# Patient Record
Sex: Female | Born: 1955 | Race: Black or African American | Hispanic: No | Marital: Married | Smoking: Current some day smoker
Health system: Southern US, Community
[De-identification: ages and names within clinical notes are randomized; demographics above are authoritative.]

## PROBLEM LIST (undated history)

## (undated) DIAGNOSIS — K219 Gastro-esophageal reflux disease without esophagitis: Secondary | ICD-10-CM

## (undated) DIAGNOSIS — G43519 Persistent migraine aura without cerebral infarction, intractable, without status migrainosus: Secondary | ICD-10-CM

## (undated) DIAGNOSIS — I10 Essential (primary) hypertension: Secondary | ICD-10-CM

## (undated) DIAGNOSIS — Z803 Family history of malignant neoplasm of breast: Secondary | ICD-10-CM

## (undated) DIAGNOSIS — E119 Type 2 diabetes mellitus without complications: Secondary | ICD-10-CM

## (undated) HISTORY — DX: Family history of malignant neoplasm of breast: Z80.3

---

## 1999-12-27 ENCOUNTER — Ambulatory Visit (HOSPITAL_BASED_OUTPATIENT_CLINIC_OR_DEPARTMENT_OTHER): Admission: RE | Admit: 1999-12-27 | Discharge: 1999-12-27 | Payer: Self-pay | Admitting: Internal Medicine

## 2005-04-01 ENCOUNTER — Ambulatory Visit (HOSPITAL_COMMUNITY): Admission: RE | Admit: 2005-04-01 | Discharge: 2005-04-01 | Payer: Self-pay | Admitting: Orthopedic Surgery

## 2012-01-01 ENCOUNTER — Other Ambulatory Visit: Payer: Self-pay | Admitting: Internal Medicine

## 2012-01-01 DIAGNOSIS — I83813 Varicose veins of bilateral lower extremities with pain: Secondary | ICD-10-CM

## 2012-01-22 ENCOUNTER — Other Ambulatory Visit: Payer: Self-pay

## 2012-01-28 ENCOUNTER — Other Ambulatory Visit: Payer: Self-pay | Admitting: Internal Medicine

## 2012-01-28 ENCOUNTER — Ambulatory Visit
Admission: RE | Admit: 2012-01-28 | Discharge: 2012-01-28 | Disposition: A | Discharge status: Home / Self Care | Payer: BC Managed Care – PPO | Source: Ambulatory Visit | Attending: Internal Medicine | Admitting: Internal Medicine

## 2012-01-28 DIAGNOSIS — I83813 Varicose veins of bilateral lower extremities with pain: Secondary | ICD-10-CM

## 2012-01-31 NOTE — Progress Notes (Signed)
Patient ID: Alexa Mora, female   DOB: 25-Sep-1955, 56 y.o.   MRN: [id number]  She is a 56 year old female who complains of lower extremity pain, left side greater than right. Her main complaint is aching, burning and itching along the left posterior and left medial calf. She has not worn compression stockings in the past. She does take ibuprofen 800 mg as needed for her lower extremity pain. She has also had some symptoms associated with sciatica and plantar fasciitis. 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 She is now retired. She has a maternal aunt who also has problems with varicose veins. She took hormone replacement therapy for six months but stopped that six years ago. Pregnancy history is G3, P2.  Past medical history is significant for high blood pressure, cholesterol, migraines, multiple thyroid nodules and depression. Surgery includes bilateral elbow surgeries and left knee surgery.  Medications: Norvasc 10 mg, Allegra 180 mg, aspirin 81 mg, vitamin D, Mobic 7.5 mg. She takes Keppra for migraines.  Allergies: None.  Social History: She is married and retired. She lives in [place]. She smokes occasionally; a pack lasts her 1-2 weeks. She does drink caffeine. She denies alcohol use.  Review of systems: Overall, she feels she is in good health. She denies ear, nose, mouth or throat problems. She denies gastrointestinal or genitourinary problems. She is post menopausal. Cardiovascular is significant for high blood pressure and high cholesterol. Musculoskeletal is significant for bilateral elbow surgery and left knee surgery. Neurologic is significant for migraines. She denies pulmonary problems.  Physical examination: Vital signs: Temperature 98.1, respirations 16, pulse 68, blood pressure 134/78. Oxygen saturation is 98% on room air. General: Healthy appearing Page 2 Re: Alexa Mora Kitchen (DOB: 2055-10-04)  white female.  Heart: Regular rate and rhythm. Lungs: Clear to auscultation. Abdomen: Soft and nontender. Palpable groin and pedal pulses bilaterally.  Lower extremities: She has varicosities along the medial and posterior aspect of the left calf. The left calf is slightly larger than the right. No significant spider veins or telangiectasias on either lower extremity. No definite varicosities in the right lower extremity.  Imaging: Ultrasound of the left lower extremity is negative for left lower extremity DVT. The study is positive for venous insufficiency involving the short saphenous vein. Multiple varicosities associated with the enlarged short saphenous vein. There is very mild reflux involving the great saphenous vein in the proximal calf.  Assessment: 56 year old with symptomatic venous insufficiency and varicosities in the left lower leg. The left lower extremity symptoms are significant enough that the patient takes anti- inflammatory medication to control the symptoms. The ultrasound examination demonstrates significant venous insufficiency in the short saphenous vein with large associated varicosities. I believe the patient would be a good candidate for endovascular laser treatment to the short saphenous vein and she may need additional sclerotherapy for treatment of the varicosities. The patient has never worn support hose or compression stockings in the past. I will prescribe her bilateral lower extremity compression stockings measuring 20-30 mmHg. We will try three months of conservative treatment. If the conservative therapy does not help with the patient's symptoms, then we will proceed with endovascular laser treatment of the short saphenous vein and ultrasound guided sclerotherapy.

## 2012-03-25 ENCOUNTER — Other Ambulatory Visit: Payer: Self-pay | Admitting: Diagnostic Radiology

## 2012-03-25 DIAGNOSIS — I83812 Varicose veins of left lower extremities with pain: Secondary | ICD-10-CM

## 2012-04-29 ENCOUNTER — Ambulatory Visit
Admission: RE | Admit: 2012-04-29 | Discharge: 2012-04-29 | Disposition: A | Discharge status: Home / Self Care | Payer: BC Managed Care – PPO | Source: Ambulatory Visit | Attending: Diagnostic Radiology | Admitting: Diagnostic Radiology

## 2012-04-29 DIAGNOSIS — I83812 Varicose veins of left lower extremities with pain: Secondary | ICD-10-CM

## 2012-04-29 HISTORY — DX: Essential (primary) hypertension: I10

## 2012-04-29 HISTORY — DX: Type 2 diabetes mellitus without complications: E11.9

## 2012-04-29 HISTORY — DX: Gastro-esophageal reflux disease without esophagitis: K21.9

## 2012-04-29 HISTORY — DX: Persistent migraine aura without cerebral infarction, intractable, without status migrainosus: G43.519

## 2012-04-29 NOTE — Progress Notes (Signed)
"  Legs still hurt" despite wearing compression stockings for the past three months.  jkl

## 2012-05-22 ENCOUNTER — Other Ambulatory Visit: Payer: Self-pay | Admitting: Diagnostic Radiology

## 2012-05-22 DIAGNOSIS — I83819 Varicose veins of unspecified lower extremities with pain: Secondary | ICD-10-CM

## 2012-08-20 ENCOUNTER — Ambulatory Visit: Payer: Self-pay | Admitting: Otolaryngology

## 2012-09-10 ENCOUNTER — Ambulatory Visit: Payer: Self-pay | Admitting: Otolaryngology

## 2012-10-22 ENCOUNTER — Ambulatory Visit: Payer: Self-pay | Admitting: Otolaryngology

## 2013-12-02 ENCOUNTER — Other Ambulatory Visit (HOSPITAL_COMMUNITY): Payer: Self-pay | Admitting: Diagnostic Radiology

## 2013-12-02 DIAGNOSIS — I83812 Varicose veins of left lower extremities with pain: Secondary | ICD-10-CM

## 2013-12-08 ENCOUNTER — Ambulatory Visit
Admission: RE | Admit: 2013-12-08 | Discharge: 2013-12-08 | Disposition: A | Discharge status: Home / Self Care | Payer: BC Managed Care – PPO | Source: Ambulatory Visit | Attending: Diagnostic Radiology | Admitting: Diagnostic Radiology

## 2013-12-08 ENCOUNTER — Encounter (INDEPENDENT_AMBULATORY_CARE_PROVIDER_SITE_OTHER): Payer: Self-pay

## 2013-12-08 DIAGNOSIS — I83812 Varicose veins of left lower extremities with pain: Secondary | ICD-10-CM

## 2013-12-09 ENCOUNTER — Telehealth: Payer: Self-pay | Admitting: Radiology

## 2013-12-09 NOTE — Telephone Encounter (Signed)
Call to remind patient to monitor B/P at home/pharmacy.  B/P was elevated yesterday, 167/78.  Advised patient to follow up with primary care physician for elevated B/P.    Patient is in agreement.  Eliana Lueth Riki R[place], [place] 12/09/2013 9:59 AM

## 2014-01-13 ENCOUNTER — Telehealth: Payer: Self-pay | Admitting: *Deleted

## 2014-01-13 ENCOUNTER — Telehealth: Payer: Self-pay | Admitting: Emergency Medicine

## 2014-01-13 NOTE — Telephone Encounter (Signed)
Left message for pt to return my call so I can schedule a genetic appt.  

## 2014-01-13 NOTE — Telephone Encounter (Signed)
CALLED PT TO MAKE HER AWARE THAT HER INS. HAS APPROVED HER EVLT.  PT WANTS TO WAIT TO SCHEDULE FOR MID SEPT.  WE WILL CALL HER WHEN WE HAVE THE SCHEDULE AVA.

## 2014-01-13 NOTE — Telephone Encounter (Signed)
Pt returned my call and I confirmed 01/26/14 genetic appt w/ her.  Mickeal Skinner at referring to make her aware.  Took paperwork upstairs for counselor.  Mailed welcoming packet & calendar to pt.

## 2014-01-26 ENCOUNTER — Other Ambulatory Visit: Payer: BC Managed Care – PPO

## 2014-01-26 ENCOUNTER — Ambulatory Visit (HOSPITAL_BASED_OUTPATIENT_CLINIC_OR_DEPARTMENT_OTHER): Payer: BC Managed Care – PPO | Admitting: Genetic Counselor

## 2014-01-26 ENCOUNTER — Encounter: Payer: Self-pay | Admitting: Genetic Counselor

## 2014-01-26 DIAGNOSIS — IMO0002 Reserved for concepts with insufficient information to code with codable children: Secondary | ICD-10-CM

## 2014-01-26 DIAGNOSIS — Z803 Family history of malignant neoplasm of breast: Secondary | ICD-10-CM

## 2014-01-26 NOTE — Progress Notes (Signed)
Patient Name: Alexa Mora Patient Age: 58 y.o. Encounter Date: 01/26/2014  Referring Provider: Lucita Lora, PA    Ms. Alexa Mora, a 58 y.o. female, is being seen at the [facility] due to a family history of breast cancer.  She presents to clinic today to discuss the possibility of a hereditary predisposition to cancer and discuss whether genetic testing is warranted.  HISTORY OF PRESENT ILLNESS: Ms. Alexa Mora has no personal history of cancer and is in generally good health. She stated that she has a yearly mammogram, gynecologic exam and clinical breast exam. She reported that her colonoscopy last year was negative for polyps.  Past Medical History  Diagnosis Date  . Migraine aura, persistent, intractable   . GERD (gastroesophageal reflux disease)   . Hypertension   . Diabetes mellitus without complication   . Family history of malignant neoplasm of breast     History   Social History  . Marital Status: Married    Spouse Name: N/A    Number of Children: N/A  . Years of Education: N/A   Social History Main Topics  . Smoking status: Current Some Day Smoker -- 30 years    Types: Cigarettes  . Smokeless tobacco: Never Used  . Alcohol Use: No  . Drug Use: No  . Sexual Activity: Not on file   Other Topics Concern  . Not on file   Social History Narrative  . No narrative on file     FAMILY HISTORY:   During the visit, a 4-generation pedigree was obtained. Significant diagnoses include the following:  Family History  Problem Relation Age of Onset  . Breast cancer Sister 52    currently 81  . Breast cancer Cousin     Age dx?; currently 61; paternal first-cousin    Additionally, Ms. Alexa Mora has 2 daughters (age 55 and 43). She has 3 other sisters and 2 brothers who are all cancer-free. Her mother is 56 and cancer-free. Her mother had 4 sisters and 5 brothers who were all cancer-free as were Ms. Alexa Mora's maternal grandparents.  Ms. Alexa Mora father  died at 66, cancer-free. He had 2 full brothers and several paternal half-siblings who were all cancer-free. One of Ms. Alexa Mora's paternal first-cousins had post-menopausal breast cancer. Her paternal grandparents died in their 77s, cancer-free.  Ms. Alexa Mora ancestry is African American. There is no known Jewish ancestry and no consanguinity.  ASSESSMENT AND PLAN: Ms. Alexa Mora is a 58 y.o. female with a family history of breast cancer at later ages of onset in her sister and one paternal first cousin. This history is not suggestive of a hereditary predisposition to cancer. She has a very large family with many unaffected relatives. We reviewed the characteristics, features and inheritance patterns of hereditary cancer syndromes to explain why she is at low risk.   We discussed with Ms. Alexa Mora that the family history is not consistent with a familial hereditary cancer syndrome, and we feel she is at low risk to harbor a gene mutation associated with such a condition. Genetic testing was not recommend at this time. We recommended Ms. Alexa Mora continue to have a yearly mammogram, a yearly clinical breast exam, perform monthly breast self-exams and have a yearly gynecologic exam. She indicated that her colonosocpy last year was negative for polyps. We will defer to her GI physician as to when her next screening should be.   We encouraged Ms. Alexa Mora to remain in contact with Cancer Genetics annually so that we can  update the family history and inform her of any changes in cancer genetics and testing that may be of benefit for this family. Ms.  Alexa Mora questions were answered to her satisfaction today.   Thank you for the referral and allowing [place] to share in the care of your patient.   The patient was seen for a total of 25 minutes, greater than 50% of which was spent face-to-face counseling. This patient was discussed with the overseeing provider who agrees with the above.   Steele Berg, MS, [place] Certified Genetic  Counseor phone: [phone] [name]

## 2014-03-04 ENCOUNTER — Other Ambulatory Visit (HOSPITAL_COMMUNITY): Payer: Self-pay | Admitting: Diagnostic Radiology

## 2014-03-04 DIAGNOSIS — I872 Venous insufficiency (chronic) (peripheral): Secondary | ICD-10-CM

## 2014-06-08 ENCOUNTER — Ambulatory Visit: Payer: BC Managed Care – PPO

## 2014-06-08 ENCOUNTER — Inpatient Hospital Stay
Admission: RE | Admit: 2014-06-08 | Discharge: 2014-06-08 | Disposition: A | Discharge status: Home / Self Care | Payer: BC Managed Care – PPO | Source: Ambulatory Visit | Attending: Diagnostic Radiology | Admitting: Diagnostic Radiology

## 2014-06-13 ENCOUNTER — Other Ambulatory Visit: Payer: BC Managed Care – PPO

## 2014-06-13 ENCOUNTER — Ambulatory Visit: Payer: BC Managed Care – PPO

## 2014-06-16 ENCOUNTER — Other Ambulatory Visit: Payer: BC Managed Care – PPO

## 2014-08-15 ENCOUNTER — Other Ambulatory Visit (HOSPITAL_COMMUNITY): Payer: Self-pay | Admitting: Diagnostic Radiology

## 2014-08-15 DIAGNOSIS — I872 Venous insufficiency (chronic) (peripheral): Secondary | ICD-10-CM

## 2014-08-25 ENCOUNTER — Ambulatory Visit
Admission: RE | Admit: 2014-08-25 | Discharge: 2014-08-25 | Disposition: A | Discharge status: Home / Self Care | Payer: Self-pay | Source: Ambulatory Visit | Attending: Diagnostic Radiology | Admitting: Diagnostic Radiology

## 2014-08-25 DIAGNOSIS — I872 Venous insufficiency (chronic) (peripheral): Secondary | ICD-10-CM

## 2014-08-25 NOTE — Progress Notes (Signed)
4742  Informed consent obtained.  Patient given verbal & written discharge instructions & states that she understands.  0800  Valium 5 mg po per Dr Anselm Pancoast.  0805  22 gauge x 1" insyte angiocath started IV Left antecubital (on 1st attempt).  Flushed w/ 10 mL NSS without difficulty.  Site unremarkable.  5956  Procedure started.  1045  Procedure completed.  Tolerated well.  Wearing thigh high graduated compression garment (20-30 mm Hg) on Left leg.  1055  IV discontinued, catheter intact.  Site unremarkable.  1100  Ambulated x 10 mins without assistance.  Gait steady.   1115  Discharged to home.  Spouse available to provide transportation.  Chancelor Hardrick [place], RN 08/25/2014 11:18 AM

## 2014-08-26 ENCOUNTER — Other Ambulatory Visit: Payer: Self-pay

## 2014-08-26 ENCOUNTER — Ambulatory Visit: Payer: Self-pay

## 2014-08-31 ENCOUNTER — Ambulatory Visit
Admission: RE | Admit: 2014-08-31 | Discharge: 2014-08-31 | Disposition: A | Discharge status: Home / Self Care | Payer: BLUE CROSS/BLUE SHIELD | Source: Ambulatory Visit | Attending: Diagnostic Radiology | Admitting: Diagnostic Radiology

## 2014-08-31 ENCOUNTER — Other Ambulatory Visit (HOSPITAL_COMMUNITY): Payer: Self-pay | Admitting: Diagnostic Radiology

## 2014-08-31 DIAGNOSIS — I872 Venous insufficiency (chronic) (peripheral): Secondary | ICD-10-CM

## 2014-08-31 NOTE — Progress Notes (Signed)
Chief Complaint: Status post endovenous laser occlusion of the left short saphenous vein on 08/25/2014 by Dr. Anselm Pancoast.  History of Present Illness: Alexa Mora is a 59 y.o. female one week status post laser occlusion of the left short saphenous vein to treat symptomatic venous insufficiency. The patient has done well since treatment and has already noticed significant decrease in left lower extremity pain. She has been wearing her compression stockings nearly 24 hours a day since treatment. She has had no difficulty ambulating or other symptoms in the left lower externa. She denies fever. She would like to start exercising.  Past Medical History  Diagnosis Date  . Migraine aura, persistent, intractable   . GERD (gastroesophageal reflux disease)   . Hypertension   . Diabetes mellitus without complication   . Family history of malignant neoplasm of breast     No past surgical history on file.  Allergies: Sulfa antibiotics  Medications: Prior to Admission medications   Medication Sig Start Date End Date Taking? Authorizing Provider  amLODipine (NORVASC) 10 MG tablet Take 10 mg by mouth daily.    Historical Provider, MD  aspirin 81 MG tablet Take 81 mg by mouth daily.    Historical Provider, MD  cholecalciferol (VITAMIN D) 1000 UNITS tablet Take 2,000 Units by mouth daily.    Historical Provider, MD  diphenhydrAMINE (BENADRYL) 25 MG tablet Take 25 mg by mouth every 6 (six) hours as needed.    Historical Provider, MD  fexofenadine (ALLEGRA) 180 MG tablet Take 180 mg by mouth daily.    Historical Provider, MD  levETIRAcetam (KEPPRA) 500 MG tablet Take 500 mg by mouth daily.    Historical Provider, MD  meloxicam (MOBIC) 15 MG tablet Take 15 mg by mouth daily.    Historical Provider, MD  omeprazole (PRILOSEC) 10 MG capsule Take 20 mg by mouth daily.    Historical Provider, MD  ranitidine (ZANTAC) 150 MG tablet Take 150 mg by mouth 2 (two) times daily.    Historical Provider, MD    rosuvastatin (CRESTOR) 5 MG tablet Take 5 mg by mouth daily.    Historical Provider, MD    Family History  Problem Relation Age of Onset  . Breast cancer Sister 77    currently 21  . Breast cancer Cousin     Age dx?; currently 32; paternal first-cousin    History   Social History  . Marital Status: Married    Spouse Name: N/A  . Number of Children: N/A  . Years of Education: N/A   Social History Main Topics  . Smoking status: Current Some Day Smoker -- 30 years    Types: Cigarettes  . Smokeless tobacco: Never Used  . Alcohol Use: No  . Drug Use: No  . Sexual Activity: Not on file   Other Topics Concern  . Not on file   Social History Narrative  . No narrative on file    Review of Systems: A 12 point ROS discussed and pertinent positives are indicated in the HPI above.  All other systems are negative.  Review of Systems  Constitutional: Negative.   Musculoskeletal: Negative.   Neurological: Negative.     Vital Signs: There were no vitals taken for this visit.  Physical Exam  Constitutional: No distress.  Musculoskeletal: She exhibits no edema or tenderness.  No ecchymosis, hematoma or drainage at the treatment site. Lower calf entry site is healed. No evidence of erythema or tenderness.  Skin: She is not diaphoretic.  Imaging: Ir Ileana Ladd Venous Not [place] [employer]  08/25/2014   CLINICAL DATA:  59 year old with symptomatic left lower extremity venous insufficiency. The venous insufficiency involves the left short saphenous vein.  EXAM: ENDOVASCULAR LASER TREATMENT TO THE LEFT SHORT SAPHENOUS VEIN  Physician: Stephan Minister. Anselm Pancoast, MD  FLUOROSCOPY TIME:  None  MEDICATIONS AND MEDICAL HISTORY: Valium oral  ANESTHESIA/SEDATION: Moderate sedation time: None  PROCEDURE: The procedure was explained to the patient. The risks and benefits of the procedure were discussed and that patient's questions were addressed. Informed consent was obtained from the  patient. The patient was placed in a prone position. The left lower extremity superficial venous system was evaluated with ultrasound. The left short saphenous vein was identified and the vein path was marked on the skin. The posterior left lower thigh and left calf were prepped with Betadine and a sterile drape was placed. Maximal barrier sterile technique was utilized including sterile gowns, sterile gloves, sterile drape, hand hygiene and skin antiseptic. The posterior lower calf was anesthetized with 1% lidocaine. A 21 gauge needle was directed into the short saphenous vein with ultrasound guidance and a micropuncture dilator set was placed. Wire was advanced into the proximal left short saphenous vein. A 0.035 wire was advanced to the saphenopopliteal junction with ultrasound guidance. A 45 cm sheath was placed over the wire and positioned distal to the saphenopopliteal junction. The [employer] VenaCure laser was inserted through the sheath and positioned approximately 2.3 cm from the saphenopopliteal junction. Dilute lidocaine was used as a tumescent. The left short saphenous vein vein from the percutaneous entry site to the saphenopopliteal junction was treated with 250 ml of the tumescent. The laser treatment was performed with 1315.6 joules over 164.5 seconds. The sheath and laser were removed as a single entity. Manual compression was placed over the percutaneous entry site. Dressings were placed over the puncture sites and the leg was placed in a compression stocking.  FINDINGS: The left short saphenous vein was accessed in the lower calf and the laser was positioned approximately 2.3 cm from the saphenopopliteal junction. There is a prominent varicosity that parallels the course of the left short saphenous vein and communicates with the short saphenous vein.  IMPRESSION: Successful transcatheter laser occlusion of the left short saphenous vein with ultrasound guidance.   Electronically Signed   By:  Markus Daft M.D.   On: 08/25/2014 12:29    Labs:  CBC: No results for input(s): WBC, HGB, HCT, PLT in the last 8760 hours.  COAGS: No results for input(s): INR, APTT in the last 8760 hours.  BMP: No results for input(s): NA, K, CL, CO2, GLUCOSE, BUN, CALCIUM, CREATININE, GFRNONAA, GFRAA in the last 8760 hours.  Invalid input(s): CMP  LIVER FUNCTION TESTS: No results for input(s): BILITOT, AST, ALT, ALKPHOS, PROT, ALBUMIN in the last 8760 hours.  TUMOR MARKERS: No results for input(s): AFPTM, CEA, CA199, CHROMGRNA in the last 8760 hours.  Assessment and Plan:  The patient is doing well after laser occlusion of the left short saphenous vein with significant improvement in symptoms. Ultrasound today shows occlusion of the treated segment of the left short saphenous vein. There is a patent branch in the mid calf that communicates with open varicosities as well as a perforator vein. This will be followed on her 1 month follow-up visit. Currently, given improvement in symptoms, these varicosities do not require sclerotherapy. The patient will follow-up with Dr. Anselm Pancoast to discuss management of these varicosities as well  as evaluation of the right lower extremity. After treatment of the left lower extremity, the patient has noticed more chronic pain in the right lower extremity.  SignedAletta Edouard T 08/31/2014, 1:43 PM     I spent a total of 20 minutes face to face in clinical consultation, greater than 50% of which was counseling/coordinating care post endovenous treatment of the left short saphenous vein.

## 2014-09-22 ENCOUNTER — Other Ambulatory Visit: Payer: Self-pay

## 2014-10-13 ENCOUNTER — Ambulatory Visit
Admission: RE | Admit: 2014-10-13 | Discharge: 2014-10-13 | Disposition: A | Discharge status: Home / Self Care | Payer: BLUE CROSS/BLUE SHIELD | Source: Ambulatory Visit | Attending: Diagnostic Radiology | Admitting: Diagnostic Radiology

## 2014-10-13 ENCOUNTER — Ambulatory Visit: Admission: RE | Admit: 2014-10-13 | Payer: BLUE CROSS/BLUE SHIELD | Source: Ambulatory Visit

## 2014-10-13 DIAGNOSIS — I872 Venous insufficiency (chronic) (peripheral): Secondary | ICD-10-CM

## 2014-10-13 NOTE — Consult Note (Signed)
Chief Complaint: Right leg pain Follow-up laser treatment to left leg  Referring Physician(s): Lindzee Gouge  History of Present Illness: Alexa Mora is a 59 y.o. female with history of left lower extremity venous insufficiency. The left short saphenous vein was treated with endovascular laser therapy on 08/25/2014. She tolerated this procedure very well and presents for follow-up. She is very happy with the treatment to the left lower extremity. The left leg pain has essentially resolved. She has minimal tenderness at the puncture site in the posterior distal calf. In addition, there has been a positive cosmetic effect in the left lower extremity. Overall, the patient is very happy with the outcome of the left lower extremity treatment.   Patient has been wearing the compression stockings on a regular basis. Now that the left leg pain has resolved she is noticing right leg pain more often. The right leg pain has been going on for many years but it was always been less painful than the left side. She describes the right leg pain as a deep pain within the right calf. She notices it mostly at night and complains of mild swelling. She occasionally will take high doses of ibuprofen to get relief from the pain. She has worn compression stockings on the right lower extremity in the past and again wore them during the treatment of the left lower extremity. She gets no relief from the compression stockings. She would like [place] to evaluate the right lower extremity for venous insufficiency.  Past Medical History  Diagnosis Date  . Migraine aura, persistent, intractable   . GERD (gastroesophageal reflux disease)   . Hypertension   . Diabetes mellitus without complication   . Family history of malignant neoplasm of breast     No past surgical history on file.  Allergies: Sulfa antibiotics  Medications: Prior to Admission medications   Medication Sig Start Date End Date Taking? Authorizing  Provider  amLODipine (NORVASC) 10 MG tablet Take 10 mg by mouth daily.    Historical Provider, MD  aspirin 81 MG tablet Take 81 mg by mouth daily.    Historical Provider, MD  cholecalciferol (VITAMIN D) 1000 UNITS tablet Take 2,000 Units by mouth daily.    Historical Provider, MD  diphenhydrAMINE (BENADRYL) 25 MG tablet Take 25 mg by mouth every 6 (six) hours as needed.    Historical Provider, MD  fexofenadine (ALLEGRA) 180 MG tablet Take 180 mg by mouth daily.    Historical Provider, MD  levETIRAcetam (KEPPRA) 500 MG tablet Take 500 mg by mouth daily.    Historical Provider, MD  meloxicam (MOBIC) 15 MG tablet Take 15 mg by mouth daily.    Historical Provider, MD  omeprazole (PRILOSEC) 10 MG capsule Take 20 mg by mouth daily.    Historical Provider, MD  ranitidine (ZANTAC) 150 MG tablet Take 150 mg by mouth 2 (two) times daily.    Historical Provider, MD  rosuvastatin (CRESTOR) 5 MG tablet Take 5 mg by mouth daily.    Historical Provider, MD     Family History  Problem Relation Age of Onset  . Breast cancer Sister 27    currently 6  . Breast cancer Cousin     Age dx?; currently 60; paternal first-cousin    History   Social History  . Marital Status: Married    Spouse Name: N/A  . Number of Children: N/A  . Years of Education: N/A   Social History Main Topics  . Smoking status: Current  Some Day Smoker -- 30 years    Types: Cigarettes  . Smokeless tobacco: Never Used  . Alcohol Use: No  . Drug Use: No  . Sexual Activity: Not on file   Other Topics Concern  . Not on file   Social History Narrative  . No narrative on file     Review of Systems  Constitutional: Negative.     Vital Signs: There were no vitals taken for this visit.  Physical Exam  Constitutional: She appears well-developed and well-nourished.  Right leg: Posterior distal calf puncture site is well-healed. No erythema. Palpable subtle varicosities in the calf but these are not very visible. Left leg:  No edema. No obvious varicosities.    Imaging: US Venous Img Lower Unilateral Left  10/13/2014   CLINICAL DATA:  Endovascular laser treatment to the left short saphenous vein. Follow-up examination.  EXAM: LEFT LOWER EXTREMITY VENOUS DOPPLER ULTRASOUND  TECHNIQUE: Gray-scale sonography with graded compression, as well as color Doppler and duplex ultrasound, were performed to evaluate the deep venous system from the level of the common femoral vein through the popliteal and proximal calf veins. Spectral Doppler was utilized to evaluate flow at rest and with distal augmentation maneuvers. Left superficial venous system was evaluated.  COMPARISON:  08/31/2014  FINDINGS: Deep system: Normal compressibility, augmentation and color Doppler flow in the left common femoral vein, left femoral vein and left popliteal vein. Visualized deep left calf veins are patent. The left saphenofemoral junction is patent.  Superficial venous system: The treated left short saphenous vein is non compressible and occluded. There is a parallel branch in the mid calf that is partially occluded. There are small varicosities coming off this parallel branch which are also associated with perforator branches. The small varices are partially thrombosed.  Other: There is an anechoic structure in the popliteal fossa that measures 4.1 x 0.7 x 3.1 cm. Findings are compatible with a Baker's cyst.  IMPRESSION: Treated left short saphenous vein is occluded.  Small varicosities in the calf are partially thrombosed.  Negative for left lower extremity deep vein thrombosis.   Electronically Signed   By: Markus Daft M.D.   On: 10/13/2014 13:30   US Venous Img Lower Unilateral Right  10/13/2014   CLINICAL DATA:  History of venous insufficiency in the left short saphenous vein that was recently treated. The patient has long-standing pain in the right calf area. The right leg pain is now more noticeable because the pain in the left lower extremity has  resolved after treatment of the venous insufficiency.  EXAM: RIGHT LOWER EXTREMITY VENOUS DUPLEX ULTRASOUND  TECHNIQUE: Gray-scale sonography with graded compression, as well as color Doppler and duplex ultrasound, were performed to evaluate the deep and superficial veins in the right lower extremity. Spectral Doppler was utilized to evaluate flow at rest and with distal augmentation maneuvers. A complete superficial venous insufficiency exam was performed in the upright standing position. I personally performed the technical portion of the exam.  COMPARISON:  None.  FINDINGS: Superficial venous system: The right saphenofemoral junction is patent. The right great saphenous vein is small and there is no evidence for reflux. The right saphenopopliteal junction is patent. The proximal calf short saphenous vein is enlarged measuring up to 1.5 cm and there is severe reflux at this level. Greater than 4 seconds of reflux was identified in the proximal calf. There is 2.8 seconds of reflux in the mid calf short saphenous vein. 1.1 seconds of reflux in the distal  calf short saphenous vein. There are prominent branch vessels coming off the mid calf short saphenous vein which are associated with perforator branches. No large superficial varicosities.  Deep venous system: Normal compressibility, augmentation and color Doppler flow in the right common femoral vein, right femoral vein and right popliteal vein. The right saphenofemoral junction is patent. Visualized deep right calf veins are patent.  IMPRESSION: There is venous insufficiency in the right short saphenous vein. The proximal calf short saphenous vein is markedly enlarged, measuring 1.5 cm, with greater than 4 seconds of reflux.  Negative for right lower extremity deep vein thrombosis.   Electronically Signed   By: Markus Daft M.D.   On: 10/13/2014 16:27    Labs:  CBC: No results for input(s): WBC, HGB, HCT, PLT in the last 8760 hours.  COAGS: No results for  input(s): INR, APTT in the last 8760 hours.  BMP: No results for input(s): NA, K, CL, CO2, GLUCOSE, BUN, CALCIUM, CREATININE, GFRNONAA, GFRAA in the last 8760 hours.  Invalid input(s): CMP  LIVER FUNCTION TESTS: No results for input(s): BILITOT, AST, ALT, ALKPHOS, PROT, ALBUMIN in the last 8760 hours.  TUMOR MARKERS: No results for input(s): AFPTM, CEA, CA199, CHROMGRNA in the last 8760 hours.  Assessment and Plan:  Left lower extremity venous insufficiency: Successful occlusion of the left short saphenous vein for venous insufficiency. The patient's pain symptoms in the left leg have essentially resolved. There are no significant residual varicosities in the left calf. Small varicosities appear to be partially thrombosed. At this time, I told the patient that she can wear the compression stockings as needed. She may resume her regular activities and exercise. No plans for additional treatment to the left lower extremity at this time.  Right lower extremity pain:  There is evidence for reflux and venous insufficiency in the right short saphenous vein. The right short saphenous vein is markedly enlarged and demonstrates greater than 4 seconds of reflux. The patient's pain and occasional edema is likely secondary to this venous insufficiency. The patient has worn right compression stockings for long periods of time and has not had relief from these right leg symptoms. The right leg symptoms are more noticeable now that the left leg has been treated. I feel the patient has failed conservative therapy to the right lower extremity venous insufficiency. I believe the patient is a good candidate for endovascular laser treatment to the right short saphenous vein. The CEAP classification is C3, Ep, As, Pr. We will plan the procedure at the patient's convenience.  SignedCarylon Perches 10/13/2014, 6:05 PM   I spent a total of  15 Minutes in face to face in clinical consultation, greater than 50% of  which was counseling/coordinating care for the lower extremity venous insufficiency.

## 2014-11-04 NOTE — Op Note (Signed)
PATIENT NAME:  Alexa Mora, Alexa Mora MR#:  [id number] DATE OF BIRTH:  09-02-1955  DATE OF PROCEDURE:  09/10/2012  PREOPERATIVE DIAGNOSIS: Left ptosis.   POSTOPERATIVE DIAGNOSIS: Left ptosis.  PROCEDURE: Left ptosis repair (modified Fasanella-Servat procedure).   SURGEON: Janalee Dane, M.D.   DESCRIPTION OF PROCEDURE: The patient was placed in the supine position on the operating room table. After general LMA anesthesia had been induced, the patient was turned 90 degrees clockwise from anesthesia. The left eye was anesthetized with topical proparacaine. The head and face were then draped in the usual fashion. A Desmarres retractor was used to invert the upper eyelid and after placement of a corneal shield with Lacri-Lube, the injection of local was carried out in the standard fashion. A 5-0 silk suture was used to place a retention suture. The Putterman clamp was then placed and the 5-0 fast absorbing gut suture on a S14 needle was used to place medial to lateral a horizontal mattress running suture. After the Putterman clamp, conjunctiva and Mueller's muscle had been resected to place the second ray row of sutures and the knot was buried subconjunctivally laterally. At this point, the Gentak ointment was placed. The corneal shield was removed and the eye was irrigated with balanced saline solution. Gentak ointment was then placed again in the recovery room. The patient tolerated the procedure well and was taken to the recovery room in stable condition following satisfactory completion of the procedure.   ____________________________ J. Nadeen Landau, MD jmc:aw D: 09/10/2012 10:24:41 ET T: 09/10/2012 10:42:56 ET JOB#: [id number]  cc: Janalee Dane, MD, <Dictator> Nicholos Johns MD ELECTRONICALLY SIGNED 10/04/2012 17:23

## 2014-11-04 NOTE — Op Note (Signed)
PATIENT NAME:  Alexa Mora, Alexa Mora MR#:  [id number] DATE OF BIRTH:  Oct 04, 1955  DATE OF PROCEDURE:  10/22/2012  SURGEON: Janalee Dane, M.D.   PREOPERATIVE DIAGNOSIS:  Right upper eyelid ptosis.  POSTOPERATIVE DIAGNOSIS:  Right upper eyelid ptosis.   PROCEDURE:  Right upper eyelid ptosis repair.   DESCRIPTION OF PROCEDURE:  The patient was placed in supine position on the Operating Room table. After general LMA anesthesia had been induced, the patient was turned 90 degrees counterclockwise from anesthesia. The right eye was topically anesthetized with topical Paracaine. A corneal shield was placed with Lacri-Lube for lubrication. The upper eyelid was everted over a Demarres retractor, and local anesthesia was injected along the superior border of the tarsal plate. Approximately 7 mm of conjunctiva and Mueller's muscle was then gathered in the Putterman clamp in the standard fashion after a retracting suture of 5-0 silk was placed. Once adequate conjunctiva and Mueller's muscle had been gathered in the Putterman clamp, a double-armed 5-0 fast absorbing gut on an S14 was used to suture from medial to lateral in a horizontal mattress-type fashion. The Mueller's muscle and conjunctiva was then resected in the Putterman clamp. The second row of sutures was placed from medial to lateral. A buried knot was placed laterally. The eyelid was returned to its native position, and the corneal shield was removed. Adequate correction of the ptosis had been achieved, therefore, the eye was irrigated with balanced saline solution, iced gauze was placed. The patient was returned to Anesthesia, allowed to emerge from anesthesia in the Operating Room, and taken to the Recovery Room in stable condition. There were no complications. Estimated blood loss less than 5 mL.  ____________________________ J. Nadeen Landau, MD jmc:jm D: 10/23/2012 23:53:25 ET T: 10/24/2012 10:32:59 ET JOB#: [id number]  cc: Janalee Dane, MD,  <Dictator> Nicholos Johns MD ELECTRONICALLY SIGNED 10/25/2012 22:56

## 2015-03-01 ENCOUNTER — Other Ambulatory Visit (HOSPITAL_COMMUNITY): Payer: Self-pay | Admitting: Diagnostic Radiology

## 2015-03-01 DIAGNOSIS — I872 Venous insufficiency (chronic) (peripheral): Secondary | ICD-10-CM

## 2015-03-31 ENCOUNTER — Other Ambulatory Visit (HOSPITAL_COMMUNITY): Payer: Self-pay | Admitting: Diagnostic Radiology

## 2015-03-31 DIAGNOSIS — I872 Venous insufficiency (chronic) (peripheral): Secondary | ICD-10-CM

## 2015-04-04 ENCOUNTER — Ambulatory Visit
Admission: RE | Admit: 2015-04-04 | Discharge: 2015-04-04 | Disposition: A | Discharge status: Home / Self Care | Payer: BLUE CROSS/BLUE SHIELD | Source: Ambulatory Visit | Attending: Diagnostic Radiology | Admitting: Diagnostic Radiology

## 2015-04-04 ENCOUNTER — Ambulatory Visit: Admission: RE | Admit: 2015-04-04 | Payer: BLUE CROSS/BLUE SHIELD | Source: Ambulatory Visit

## 2015-04-04 DIAGNOSIS — I872 Venous insufficiency (chronic) (peripheral): Secondary | ICD-10-CM

## 2015-04-04 NOTE — Consult Note (Signed)
Chief Complaint: Patient was seen in consultation today for follow-up of bilateral lower extremity venous insufficiency.  Referring Physician(s): Henn,Adam  History of Present Illness: Alexa Mora is a 59 y.o. female with known bilateral lower extremity venous insufficiency. The patient underwent transcatheter laser treatment to the left short saphenous vein on 08/25/2014. She has been very happy with the results of this treatment. Majority of her left lower extremity pain has resolved. She has persistent tenderness associated with residual varicosities in the posterior calf and along the medial left calf. She continues to complain of heaviness and discomfort in the right calf which is related to the known venous insufficiency in that lower extremity. The patient was scheduled for possible ultrasound-guided sclerotherapy to the varicosities in the left lower extremity but she did not have her compression stockings with her at this appointment.  Past Medical History  Diagnosis Date  . Migraine aura, persistent, intractable   . GERD (gastroesophageal reflux disease)   . Hypertension   . Diabetes mellitus without complication   . Family history of malignant neoplasm of breast     No past surgical history on file.  Allergies: Sulfa antibiotics  Medications: Prior to Admission medications   Medication Sig Start Date End Date Taking? Authorizing Provider  amLODipine (NORVASC) 10 MG tablet Take 10 mg by mouth daily.    Historical Provider, MD  aspirin 81 MG tablet Take 81 mg by mouth daily.    Historical Provider, MD  cholecalciferol (VITAMIN D) 1000 UNITS tablet Take 2,000 Units by mouth daily.    Historical Provider, MD  diphenhydrAMINE (BENADRYL) 25 MG tablet Take 25 mg by mouth every 6 (six) hours as needed.    Historical Provider, MD  fexofenadine (ALLEGRA) 180 MG tablet Take 180 mg by mouth daily.    Historical Provider, MD  levETIRAcetam (KEPPRA) 500 MG tablet Take 500 mg  by mouth daily.    Historical Provider, MD  meloxicam (MOBIC) 15 MG tablet Take 15 mg by mouth daily.    Historical Provider, MD  omeprazole (PRILOSEC) 10 MG capsule Take 20 mg by mouth daily.    Historical Provider, MD  ranitidine (ZANTAC) 150 MG tablet Take 150 mg by mouth 2 (two) times daily.    Historical Provider, MD  rosuvastatin (CRESTOR) 5 MG tablet Take 5 mg by mouth daily.    Historical Provider, MD     Family History  Problem Relation Age of Onset  . Breast cancer Sister 14    currently 20  . Breast cancer Cousin     Age dx?; currently 58; paternal first-cousin    Social History   Social History  . Marital Status: Married    Spouse Name: N/A  . Number of Children: N/A  . Years of Education: N/A   Social History Main Topics  . Smoking status: Current Some Day Smoker -- 30 years    Types: Cigarettes  . Smokeless tobacco: Never Used  . Alcohol Use: No  . Drug Use: No  . Sexual Activity: Not on file   Other Topics Concern  . Not on file   Social History Narrative  . No narrative on file      Review of Systems  Vital Signs: There were no vitals taken for this visit.  Physical Exam  Musculoskeletal:  Left leg:  No edema or erythema. Visible small varicosities along the medial left upper calf. Small varicosities along the posterior calf. Varicosities are tender to palpation, particularly along the medial  aspect.  Right leg: No significant edema. No obvious varicosities.       Imaging: US Venous Img Lower Unilateral Left  04/04/2015   CLINICAL DATA:  59 year old with bilateral lower extremity venous insufficiency. Post endovascular laser treatment to the left short saphenous vein. Patient presents for follow-up.  EXAM: LEFT LOWER EXTREMITY VENOUS DOPPLER ULTRASOUND  TECHNIQUE: Gray-scale sonography with graded compression, as well as color Doppler and duplex ultrasound, were performed to evaluate the deep venous system from the level of the common femoral  vein through the popliteal and proximal calf veins. Spectral Doppler was utilized to evaluate flow at rest and with distal augmentation maneuvers.  COMPARISON:  None.  FINDINGS: Normal compressibility, augmentation and color Doppler flow in the left common femoral vein, left femoral vein and left popliteal vein. The left saphenofemoral junction is patent. Visualized deep left calf veins are patent.  Small varicosities along the medial left upper calf are compressible. There is a large anechoic structure in the medial posterior left knee compatible with a Baker's cyst. The treated short saphenous vein is small and occluded. There is a short-segment duplication of short saphenous vein which is patent and connects to small varicosities along the posterior calf. These varicosities are also associated with a prominent perforator branch.  IMPRESSION: Treated left short saphenous vein remains occluded.  Small varicosities in the posterior calf are associated with a short-segment duplication of the short saphenous vein and a perforator branch. There are also small varicosities along the medial upper calf.  Negative for deep vein thrombosis in left lower extremity.   Electronically Signed   By: Markus Daft M.D.   On: 04/04/2015 14:31    Labs:  CBC: No results for input(s): WBC, HGB, HCT, PLT in the last 8760 hours.  COAGS: No results for input(s): INR, APTT in the last 8760 hours.  BMP: No results for input(s): NA, K, CL, CO2, GLUCOSE, BUN, CALCIUM, CREATININE, GFRNONAA, GFRAA in the last 8760 hours.  Invalid input(s): CMP  LIVER FUNCTION TESTS: No results for input(s): BILITOT, AST, ALT, ALKPHOS, PROT, ALBUMIN in the last 8760 hours.  TUMOR MARKERS: No results for input(s): AFPTM, CEA, CA199, CHROMGRNA in the last 8760 hours.  Assessment and Plan:  58 year old with known bilateral lower extremity venous insufficiency. Post endovascular laser treatment to the left short saphenous vein with excellent  results. Patient has been very happy with this treatment. There are residual small varicosities in the left calf which remain tender and symptomatic. Patient would like to have these varicosities treated if possible. The small varicosities are amenable to ultrasound-guided foam sclerotherapy. Patient continues to have symptoms in the right lower extremity related to the right short saphenous vein venous insufficiency and she is scheduled for endovascular treatment next month.  The patient did not have compression stockings with her today so we did not perform the ultrasound-guided foam sclerotherapy to the varicosities in the left lower extremity. Plan to perform ultrasound guided foam sclerotherapy to the left lower extremity varicosities at the same time that we perform endovascular treatment to the right short saphenous vein next month. Patient has been instructed to bring her compression stockings for that procedure.    SignedCarylon Perches 04/04/2015, 2:34 PM   I spent a total of 10 Minutes in face to face in clinical consultation, greater than 50% of which was counseling/coordinating care for evaluation of her venous insufficiency.

## 2015-04-11 ENCOUNTER — Other Ambulatory Visit: Payer: Self-pay

## 2015-04-25 ENCOUNTER — Ambulatory Visit
Admission: RE | Admit: 2015-04-25 | Discharge: 2015-04-25 | Disposition: A | Discharge status: Home / Self Care | Payer: BLUE CROSS/BLUE SHIELD | Source: Ambulatory Visit | Attending: Diagnostic Radiology | Admitting: Diagnostic Radiology

## 2015-04-25 ENCOUNTER — Other Ambulatory Visit (HOSPITAL_COMMUNITY): Payer: Self-pay | Admitting: Diagnostic Radiology

## 2015-04-25 VITALS — BP 143/74 | HR 72 | Temp 98.0°F | Resp 15

## 2015-04-25 DIAGNOSIS — I872 Venous insufficiency (chronic) (peripheral): Secondary | ICD-10-CM

## 2015-04-25 MED ORDER — DIAZEPAM 5 MG PO TABS: 5.0000 mg | ORAL_TABLET | Freq: Once | ORAL | Status: DC

## 2015-04-25 NOTE — Progress Notes (Signed)
6808  Informed consent obtained.  Given verbal & written discharge instructions & states that she understands.  0750  Valium 5 mg po (per Dr Anselm Pancoast).  22 gauge x 1" insyte catheter started IV Right antecubital (on 1st attempt) w/ NSL & 7" extension.  Flushed w/ 10 mL NSS without difficulty.  Site unremarkable.  0820  Procedure started.  1025  Procedure completed.  Patient tolerated well.  1035 Wearing thigh high graduated compression garment (20-30 mm Hg) on lower extremities. IV discontinued Right antecubital.  Catheter intact.  Site unremarkable.  1045  Ambulated x 10 mins without assistance.  Gait steady.  Reviewed discharge instructions.  Patient's daughter here to provide transportation home.   Latunya Kissick Riki Rusk, RN 04/25/2015 10:55 AM

## 2015-05-03 ENCOUNTER — Ambulatory Visit
Admission: RE | Admit: 2015-05-03 | Discharge: 2015-05-03 | Disposition: A | Discharge status: Home / Self Care | Payer: BLUE CROSS/BLUE SHIELD | Source: Ambulatory Visit | Attending: Diagnostic Radiology | Admitting: Diagnostic Radiology

## 2015-05-03 DIAGNOSIS — I872 Venous insufficiency (chronic) (peripheral): Secondary | ICD-10-CM

## 2015-05-03 NOTE — Progress Notes (Signed)
Referring Physician(s): Dr Anselm Pancoast  Chief Complaint: The patient is seen in follow up today s/p Successful transcatheter laser occlusion of the right short saphenous vein with ultrasound guidance.  Ultrasound-guided foam sclerotherapy to left calf varicose veins.  History of present illness:  Scheduled for follow up visit 1 week post treatment of Rt short saphenous vein and L calf varicose vein 04/25/2015 Pt has no complaints Denies pain; denies burning or itching in legs Pt walking for exercise Wearing thigh high compression stockings daily from am until bedtime   Past Medical History  Diagnosis Date  . Migraine aura, persistent, intractable   . GERD (gastroesophageal reflux disease)   . Hypertension   . Diabetes mellitus without complication   . Family history of malignant neoplasm of breast     No past surgical history on file.  Allergies: Sulfa antibiotics  Medications: Prior to Admission medications   Medication Sig Start Date End Date Taking? Authorizing Provider  amLODipine (NORVASC) 10 MG tablet Take 10 mg by mouth daily.    Historical Provider, MD  aspirin 81 MG tablet Take 81 mg by mouth daily.    Historical Provider, MD  cholecalciferol (VITAMIN D) 1000 UNITS tablet Take 2,000 Units by mouth daily.    Historical Provider, MD  diazepam (VALIUM) 5 MG tablet Take 1 tablet (5 mg total) by mouth once. 04/25/15   Markus Daft, MD  diphenhydrAMINE (BENADRYL) 25 MG tablet Take 25 mg by mouth every 6 (six) hours as needed.    Historical Provider, MD  fexofenadine (ALLEGRA) 180 MG tablet Take 180 mg by mouth daily.    Historical Provider, MD  levETIRAcetam (KEPPRA) 500 MG tablet Take 500 mg by mouth daily.    Historical Provider, MD  meloxicam (MOBIC) 15 MG tablet Take 15 mg by mouth daily.    Historical Provider, MD  omeprazole (PRILOSEC) 10 MG capsule Take 20 mg by mouth daily.    Historical Provider, MD  ranitidine (ZANTAC) 150 MG tablet Take 150 mg by mouth 2 (two)  times daily.    Historical Provider, MD  rosuvastatin (CRESTOR) 5 MG tablet Take 5 mg by mouth daily.    Historical Provider, MD     Family History  Problem Relation Age of Onset  . Breast cancer Sister 72    currently 24  . Breast cancer Cousin     Age dx?; currently 6; paternal first-cousin    Social History   Social History  . Marital Status: Married    Spouse Name: N/A  . Number of Children: N/A  . Years of Education: N/A   Social History Main Topics  . Smoking status: Current Some Day Smoker -- 30 years    Types: Cigarettes  . Smokeless tobacco: Never Used  . Alcohol Use: No  . Drug Use: No  . Sexual Activity: Not on file   Other Topics Concern  . Not on file   Social History Narrative  . No narrative on file     Vital Signs: There were no vitals taken for this visit.  Physical Exam  Constitutional: She appears well-nourished.  Musculoskeletal: Normal range of motion. She exhibits no edema.  Sites of treatment B are NT no hematoma Small scab at sites No sign of infection or skin changes Noted minimal bruising B behind knee Noted one small "knot" posterior left calf Dr Laurence Ferrari examined pt-- reasurrence small clot will gradually resolve  Ambulating well     Imaging: No results found.  Labs:  CBC:  No results for input(s): WBC, HGB, HCT, PLT in the last 8760 hours.  COAGS: No results for input(s): INR, APTT in the last 8760 hours.  BMP: No results for input(s): NA, K, CL, CO2, GLUCOSE, BUN, CALCIUM, CREATININE, GFRNONAA, GFRAA in the last 8760 hours.  Invalid input(s): CMP  LIVER FUNCTION TESTS: No results for input(s): BILITOT, AST, ALT, ALKPHOS, PROT, ALBUMIN in the last 8760 hours.  Assessment:  Post Rt short saphenous vein transcatheter laser occlusion and left calf varicose vein foam sclerotherapy 04/25/15 Doing well Continue use of compression stockings as much as possible Return for 1 month follow up and [place]  studies  Signed: Shirline Kendle A 2015-05-12, 9:17 AM   Please refer to Dr. Laurence Ferrari attestation of this note for management and plan.

## 2015-06-06 ENCOUNTER — Ambulatory Visit
Admission: RE | Admit: 2015-06-06 | Discharge: 2015-06-06 | Disposition: A | Discharge status: Home / Self Care | Payer: BLUE CROSS/BLUE SHIELD | Source: Ambulatory Visit | Attending: Diagnostic Radiology | Admitting: Diagnostic Radiology

## 2015-06-06 ENCOUNTER — Other Ambulatory Visit (HOSPITAL_COMMUNITY): Payer: Self-pay | Admitting: Diagnostic Radiology

## 2015-06-06 DIAGNOSIS — I872 Venous insufficiency (chronic) (peripheral): Secondary | ICD-10-CM

## 2015-06-06 NOTE — Consult Note (Signed)
Chief Complaint: Follow-up treatment of lower extremity venous insufficiency.  History of Present Illness: Alexa Mora is a 59 y.o. female with history of bilateral lower extremity superficial venous insufficiency. The patient had successful left short saphenous vein treated on 08/25/14 with endovascular laser treatment.  Approximately 1 month ago, the patient underwent endovascular laser treatment to the right short saphenous vein and ultrasound-guided foam sclerotherapy to varicosities in the left calf. Patient tolerated the procedures very well. Patient wore the compression stockings regularly. The pain and discomfort in the right calf has resolved since the treatment. There is mild tenderness in the left calf at an injection site. Patient continues to have mild tenderness involving small superficial veins along the proximal medial left calf. Overall, the patient has been very happy with the treatment results.  Past Medical History  Diagnosis Date  . Migraine aura, persistent, intractable   . GERD (gastroesophageal reflux disease)   . Hypertension   . Diabetes mellitus without complication   . Family history of malignant neoplasm of breast     No past surgical history on file.  Allergies: Sulfa antibiotics  Medications: Prior to Admission medications   Medication Sig Start Date End Date Taking? Authorizing Provider  amLODipine (NORVASC) 10 MG tablet Take 10 mg by mouth daily.    Historical Provider, MD  aspirin 81 MG tablet Take 81 mg by mouth daily.    Historical Provider, MD  cholecalciferol (VITAMIN D) 1000 UNITS tablet Take 2,000 Units by mouth daily.    Historical Provider, MD  diazepam (VALIUM) 5 MG tablet Take 1 tablet (5 mg total) by mouth once. 04/25/15   Markus Daft, MD  diphenhydrAMINE (BENADRYL) 25 MG tablet Take 25 mg by mouth every 6 (six) hours as needed.    Historical Provider, MD  fexofenadine (ALLEGRA) 180 MG tablet Take 180 mg by mouth daily.    Historical  Provider, MD  levETIRAcetam (KEPPRA) 500 MG tablet Take 500 mg by mouth daily.    Historical Provider, MD  meloxicam (MOBIC) 15 MG tablet Take 15 mg by mouth daily.    Historical Provider, MD  omeprazole (PRILOSEC) 10 MG capsule Take 20 mg by mouth daily.    Historical Provider, MD  ranitidine (ZANTAC) 150 MG tablet Take 150 mg by mouth 2 (two) times daily.    Historical Provider, MD  rosuvastatin (CRESTOR) 5 MG tablet Take 5 mg by mouth daily.    Historical Provider, MD     Family History  Problem Relation Age of Onset  . Breast cancer Sister 75    currently 24  . Breast cancer Cousin     Age dx?; currently 8; paternal first-cousin    Social History   Social History  . Marital Status: Married    Spouse Name: N/A  . Number of Children: N/A  . Years of Education: N/A   Social History Main Topics  . Smoking status: Current Some Day Smoker -- 30 years    Types: Cigarettes  . Smokeless tobacco: Never Used  . Alcohol Use: No  . Drug Use: No  . Sexual Activity: Not on file   Other Topics Concern  . Not on file   Social History Narrative  . No narrative on file     Review of Systems  Musculoskeletal:       Left calf pain.    Vital Signs: There were no vitals taken for this visit.  Physical Exam  Musculoskeletal: She exhibits no edema.  Right leg:  No edema.  Puncture site is healed.  No obvious varicosities.  No ulcerations.  Left leg:  Small focus of discoloration and tenderness in posterior medial calf at prior injection site.   Mild pain involving prominent superificial veins in proximal medial calf.          Imaging: CLINICAL DATA: One month follow-up of endovascular laser treatment to the right short saphenous vein and ultrasound-guided foam sclerotherapy to the left leg varicosities.  EXAM: RIGHT LOWER EXTREMITY VENOUS DOPPLER ULTRASOUND WITH LIMITED LEFT LOWER EXTREMITY VENOUS DUPLEX  TECHNIQUE: Gray-scale sonography with graded compression, as  well as color Doppler and duplex ultrasound, were performed to evaluate the deep venous system from the level of the common femoral vein through the popliteal and proximal calf veins. Spectral Doppler was utilized to evaluate flow at rest and with distal augmentation maneuvers. I personally supervised the technical portion of this examination.  COMPARISON: 05/03/2015  FINDINGS: Right deep venous system: Normal compressibility, augmentation and color Doppler flow in the right common femoral vein, right femoral vein and right popliteal vein. The right saphenofemoral junction is patent. Right profunda femoral vein is patent without thrombus. Visualized right deep calf veins are patent.  Right superficial venous system: The treated right short saphenous vein is occluded. Short saphenous vein is occluded to the saphenopopliteal junction. Popliteal vein is patent. No evidence for thrombus extending into the deep venous system. There is a patent perforator branch in the mid calf. No significant varicosities in the right calf.  Left lower extremity superficial venous system: Again noted are thrombosed varicosities along the posterior medial left calf. Few small patent superficial veins along the medial proximal calf at an area of tenderness. Again noted is a prominent vein that runs parallel to the thrombosed left short saphenous vein. This vein appears to be partially thrombosed.  IMPRESSION: Negative for a deep vein thrombosis in the right lower extremity.  Successful endovascular laser occlusion of the right short saphenous vein.  Successful sclerotherapy occlusion of varicosities along the medial aspect of the left calf. A few residual symptomatic superficial veins or varicosities remain in the left calf.   Electronically Signed  By: Markus Daft M.D.  On: 06/06/2015 11:04 Labs:  CBC: No results for input(s): WBC, HGB, HCT, PLT in the last 8760  hours.  COAGS: No results for input(s): INR, APTT in the last 8760 hours.  BMP: No results for input(s): NA, K, CL, CO2, GLUCOSE, BUN, CALCIUM, CREATININE, GFRNONAA, GFRAA in the last 8760 hours.  Invalid input(s): CMP  LIVER FUNCTION TESTS: No results for input(s): BILITOT, AST, ALT, ALKPHOS, PROT, ALBUMIN in the last 8760 hours.  TUMOR MARKERS: No results for input(s): AFPTM, CEA, CA199, CHROMGRNA in the last 8760 hours.  Assessment and Plan:   59 year old with history of bilateral lower extremity venous insufficiency. Patient has had bilateral short saphenous veins treated with endovascular laser treatment with good results. The recently treated right short saphenous vein is occluded by ultrasound and the patient's symptoms in the right calf have resolved.  The treated left short saphenous vein remains occluded by ultrasound and the large varicosities in the left calf are thrombosed. The patient is complaining of mild pain associated the thrombosed varicosities and I reassured her that this should improve with time. Patient also has some tenderness involving small varicosities or superficial veins along the proximal medial calf that are patent by ultrasound and may be related to perforator disease. The patient would be a candidate for additional ultrasound-guided foam sclerotherapy  in these vessels as well as treatment of the vein which travels parallel to the thrombosed left short saphenous vein. The patient would like to have the sclerotherapy performed after the holidays and we will try to schedule her in early January 2017.  SignedCarylon Perches 06/06/2015, 11:05 AM    I spent a total of 10 Minutes in face to face in clinical consultation, greater than 50% of which was counseling/coordinating care for venous insufficiency.

## 2015-07-21 ENCOUNTER — Other Ambulatory Visit (HOSPITAL_COMMUNITY): Payer: Self-pay | Admitting: Diagnostic Radiology

## 2015-07-21 DIAGNOSIS — I872 Venous insufficiency (chronic) (peripheral): Secondary | ICD-10-CM

## 2015-07-25 ENCOUNTER — Ambulatory Visit: Payer: BLUE CROSS/BLUE SHIELD

## 2015-07-25 ENCOUNTER — Other Ambulatory Visit: Payer: BLUE CROSS/BLUE SHIELD

## 2015-08-10 ENCOUNTER — Ambulatory Visit
Admission: RE | Admit: 2015-08-10 | Discharge: 2015-08-10 | Disposition: A | Discharge status: Home / Self Care | Payer: BLUE CROSS/BLUE SHIELD | Source: Ambulatory Visit | Attending: Diagnostic Radiology | Admitting: Diagnostic Radiology

## 2015-08-10 DIAGNOSIS — I872 Venous insufficiency (chronic) (peripheral): Secondary | ICD-10-CM

## 2015-08-10 NOTE — Progress Notes (Signed)
Chief Complaint: Pain in left calf. Follow-up sclerotherapy.  Referring Physician(s): Koree Staheli  History of Present Illness: Alexa Mora is a 60 y.o. female with known bilateral lower extremity venous insufficiency. The patient has undergone endovascular laser occlusion of the short saphenous veins bilaterally and has had excellent results. She feels that most of her symptoms in the lower extremities have resolved. She has some persistent tenderness with hard bumps in the left medial calf. This area corresponds with an area of treated varicose veins with sclerotherapy. This area of tenderness has decreasing in size but she still feels two small hard bumps. She is also noticed new small superficial veins along the upper left anterior shin.  Past Medical History  Diagnosis Date  . Migraine aura, persistent, intractable   . GERD (gastroesophageal reflux disease)   . Hypertension   . Diabetes mellitus without complication   . Family history of malignant neoplasm of breast     No past surgical history on file.  Allergies: Sulfa antibiotics  Medications: Prior to Admission medications   Medication Sig Start Date End Date Taking? Authorizing Provider  amLODipine (NORVASC) 10 MG tablet Take 10 mg by mouth daily.    Historical Provider, MD  aspirin 81 MG tablet Take 81 mg by mouth daily.    Historical Provider, MD  cholecalciferol (VITAMIN D) 1000 UNITS tablet Take 2,000 Units by mouth daily.    Historical Provider, MD  diazepam (VALIUM) 5 MG tablet Take 1 tablet (5 mg total) by mouth once. 04/25/15   Markus Daft, MD  diphenhydrAMINE (BENADRYL) 25 MG tablet Take 25 mg by mouth every 6 (six) hours as needed.    Historical Provider, MD  fexofenadine (ALLEGRA) 180 MG tablet Take 180 mg by mouth daily.    Historical Provider, MD  levETIRAcetam (KEPPRA) 500 MG tablet Take 500 mg by mouth daily.    Historical Provider, MD  meloxicam (MOBIC) 15 MG tablet Take 15 mg by mouth daily.     Historical Provider, MD  omeprazole (PRILOSEC) 10 MG capsule Take 20 mg by mouth daily.    Historical Provider, MD  ranitidine (ZANTAC) 150 MG tablet Take 150 mg by mouth 2 (two) times daily.    Historical Provider, MD  rosuvastatin (CRESTOR) 5 MG tablet Take 5 mg by mouth daily.    Historical Provider, MD     Family History  Problem Relation Age of Onset  . Breast cancer Sister 32    currently 20  . Breast cancer Cousin     Age dx?; currently 13; paternal first-cousin    Social History   Social History  . Marital Status: Married    Spouse Name: N/A  . Number of Children: N/A  . Years of Education: N/A   Social History Main Topics  . Smoking status: Current Some Day Smoker -- 30 years    Types: Cigarettes  . Smokeless tobacco: Never Used  . Alcohol Use: No  . Drug Use: No  . Sexual Activity: Not on file   Other Topics Concern  . Not on file   Social History Narrative  . No narrative on file    Review of Systems  Musculoskeletal:       Left calf tenderness    Vital Signs: There were no vitals taken for this visit.  Physical Exam  Musculoskeletal:  No significant swelling in either leg. The left calf may be slightly larger than the right calf. Small area of dark discoloration along the medial left  calf associated with the previous sclerotherapy area. This area roughly measures 3 cm in size. Few small superficial veins along the medial upper left shin. No evidence for skin breakdown or ulcerations.        Imaging: US Venous Img Lower Unilateral Left  08/10/2015  CLINICAL DATA:  60 year old with history of bilateral lower extremity venous insufficiency. Patient has had endovascular laser occlusion of the short saphenous bilaterally. Patient has also had ultrasound-guided foam sclerotherapy to left varicose veins. Patient is concerned about some discoloration and tenderness in the left calf. EXAM: LEFT LOWER EXTREMITY VENOUS DOPPLER ULTRASOUND TECHNIQUE: Gray-scale  sonography with graded compression, as well as color Doppler and duplex ultrasound, were performed to evaluate the deep venous system from the level of the common femoral vein through the popliteal and proximal calf veins. Spectral Doppler was utilized to evaluate flow at rest and with distal augmentation maneuvers. I personally performed a portion of this examination. COMPARISON:  None. FINDINGS: Normal compressibility, augmentation and color Doppler flow in the left common femoral vein, left femoral vein and left popliteal vein. The left saphenofemoral junction is patent. Left profunda femoral vein is patent without thrombus. The left great saphenous vein is compressible without thrombus. The treated left short saphenous vein is small and non compressible. The short saphenous distal to the treatment area is patent. The area of concern along the medial left calf was evaluated. There are thrombosed varicose veins at the area of concern. These thrombosed veins are associated with a prominent perforator vessel. No other varicosities were identified. IMPRESSION: Treated left short saphenous vein remains occluded. Thrombosed varicosities in the medial left calf. Electronically Signed   By: Markus Daft M.D.   On: 08/10/2015 14:50    Labs:  CBC: No results for input(s): WBC, HGB, HCT, PLT in the last 8760 hours.  COAGS: No results for input(s): INR, APTT in the last 8760 hours.  BMP: No results for input(s): NA, K, CL, CO2, GLUCOSE, BUN, CALCIUM, CREATININE, GFRNONAA, GFRAA in the last 8760 hours.  Invalid input(s): CMP  LIVER FUNCTION TESTS: No results for input(s): BILITOT, AST, ALT, ALKPHOS, PROT, ALBUMIN in the last 8760 hours.  TUMOR MARKERS: No results for input(s): AFPTM, CEA, CA199, CHROMGRNA in the last 8760 hours.  Assessment and Plan:  31 female with history of bilateral lower extremity venous insufficiency which has been successfully treated with endovascular laser occlusion to the short  saphenous veins and ultrasound guided foam sclerotherapy therapy to varicosities in the left calf.  Patient complains of mild tenderness and discoloration along the medial left calf at previous sclerotherapy site. The tenderness corresponds with previously treated and currently thrombosed varicose veins. These thrombosed varicose veins appear to be decreasing in size. However, there is a small amount of discoloration in this area. I believe that this discoloration should improve over time but explained to the patient that it may never completely resolve. The new veins along the upper left shin represent small superficial veins. No other significant varicosities are present. No plans for additional sclerotherapy at this time.  Patient has a scheduled follow-up in a few months. She will contact us if she has any questions or concerns in the interim.  Electronically Signed: Carylon Perches 08/10/2015, 2:58 PM   I spent a total of 10 Minutes in face to face in clinical consultation, greater than 50% of which was counseling/coordinating care for lower extremity venous insufficiency.

## 2016-07-23 DIAGNOSIS — E049 Nontoxic goiter, unspecified: Secondary | ICD-10-CM | POA: Diagnosis not present

## 2016-07-23 DIAGNOSIS — R51 Headache: Secondary | ICD-10-CM | POA: Diagnosis not present

## 2016-07-23 DIAGNOSIS — I10 Essential (primary) hypertension: Secondary | ICD-10-CM | POA: Diagnosis not present

## 2016-07-23 DIAGNOSIS — Z23 Encounter for immunization: Secondary | ICD-10-CM | POA: Diagnosis not present

## 2016-07-23 DIAGNOSIS — E785 Hyperlipidemia, unspecified: Secondary | ICD-10-CM | POA: Diagnosis not present

## 2016-07-23 DIAGNOSIS — E559 Vitamin D deficiency, unspecified: Secondary | ICD-10-CM | POA: Diagnosis not present

## 2016-08-06 DIAGNOSIS — R69 Illness, unspecified: Secondary | ICD-10-CM | POA: Diagnosis not present

## 2016-08-06 DIAGNOSIS — Z6825 Body mass index (BMI) 25.0-25.9, adult: Secondary | ICD-10-CM | POA: Diagnosis not present

## 2016-08-06 DIAGNOSIS — J060 Acute laryngopharyngitis: Secondary | ICD-10-CM | POA: Diagnosis not present

## 2016-08-06 DIAGNOSIS — R05 Cough: Secondary | ICD-10-CM | POA: Diagnosis not present

## 2016-11-04 DIAGNOSIS — R202 Paresthesia of skin: Secondary | ICD-10-CM | POA: Diagnosis not present

## 2016-11-04 DIAGNOSIS — G43109 Migraine with aura, not intractable, without status migrainosus: Secondary | ICD-10-CM | POA: Diagnosis not present

## 2016-11-04 DIAGNOSIS — R079 Chest pain, unspecified: Secondary | ICD-10-CM | POA: Diagnosis not present

## 2016-11-04 DIAGNOSIS — G43909 Migraine, unspecified, not intractable, without status migrainosus: Secondary | ICD-10-CM | POA: Diagnosis not present

## 2016-11-26 DIAGNOSIS — L259 Unspecified contact dermatitis, unspecified cause: Secondary | ICD-10-CM | POA: Diagnosis not present

## 2016-12-20 DIAGNOSIS — Z1231 Encounter for screening mammogram for malignant neoplasm of breast: Secondary | ICD-10-CM | POA: Diagnosis not present

## 2017-01-30 DIAGNOSIS — E785 Hyperlipidemia, unspecified: Secondary | ICD-10-CM | POA: Diagnosis not present

## 2017-01-30 DIAGNOSIS — R51 Headache: Secondary | ICD-10-CM | POA: Diagnosis not present

## 2017-01-30 DIAGNOSIS — E049 Nontoxic goiter, unspecified: Secondary | ICD-10-CM | POA: Diagnosis not present

## 2017-01-30 DIAGNOSIS — E559 Vitamin D deficiency, unspecified: Secondary | ICD-10-CM | POA: Diagnosis not present

## 2017-01-30 DIAGNOSIS — I10 Essential (primary) hypertension: Secondary | ICD-10-CM | POA: Diagnosis not present

## 2017-01-30 DIAGNOSIS — S8391XA Sprain of unspecified site of right knee, initial encounter: Secondary | ICD-10-CM | POA: Diagnosis not present

## 2017-02-10 DIAGNOSIS — K219 Gastro-esophageal reflux disease without esophagitis: Secondary | ICD-10-CM | POA: Diagnosis not present

## 2017-02-10 DIAGNOSIS — Z01818 Encounter for other preprocedural examination: Secondary | ICD-10-CM | POA: Diagnosis not present

## 2017-02-27 DIAGNOSIS — Z7982 Long term (current) use of aspirin: Secondary | ICD-10-CM | POA: Diagnosis not present

## 2017-02-27 DIAGNOSIS — I10 Essential (primary) hypertension: Secondary | ICD-10-CM | POA: Diagnosis not present

## 2017-02-27 DIAGNOSIS — D126 Benign neoplasm of colon, unspecified: Secondary | ICD-10-CM | POA: Diagnosis not present

## 2017-02-27 DIAGNOSIS — Z1211 Encounter for screening for malignant neoplasm of colon: Secondary | ICD-10-CM | POA: Diagnosis not present

## 2017-02-27 DIAGNOSIS — I341 Nonrheumatic mitral (valve) prolapse: Secondary | ICD-10-CM | POA: Diagnosis not present

## 2017-02-27 DIAGNOSIS — Z86010 Personal history of colonic polyps: Secondary | ICD-10-CM | POA: Diagnosis not present

## 2017-02-27 DIAGNOSIS — Z79899 Other long term (current) drug therapy: Secondary | ICD-10-CM | POA: Diagnosis not present

## 2017-02-27 DIAGNOSIS — D124 Benign neoplasm of descending colon: Secondary | ICD-10-CM | POA: Diagnosis not present

## 2017-04-01 DIAGNOSIS — Z0000 Encounter for general adult medical examination without abnormal findings: Secondary | ICD-10-CM | POA: Diagnosis not present

## 2017-04-01 DIAGNOSIS — Z01411 Encounter for gynecological examination (general) (routine) with abnormal findings: Secondary | ICD-10-CM | POA: Diagnosis not present

## 2017-04-01 DIAGNOSIS — N8110 Cystocele, unspecified: Secondary | ICD-10-CM | POA: Diagnosis not present

## 2017-09-15 DIAGNOSIS — I10 Essential (primary) hypertension: Secondary | ICD-10-CM | POA: Diagnosis not present

## 2017-09-15 DIAGNOSIS — G43709 Chronic migraine without aura, not intractable, without status migrainosus: Secondary | ICD-10-CM | POA: Diagnosis not present

## 2017-09-15 DIAGNOSIS — F419 Anxiety disorder, unspecified: Secondary | ICD-10-CM | POA: Diagnosis not present

## 2017-09-15 DIAGNOSIS — E785 Hyperlipidemia, unspecified: Secondary | ICD-10-CM | POA: Diagnosis not present

## 2017-09-15 DIAGNOSIS — E049 Nontoxic goiter, unspecified: Secondary | ICD-10-CM | POA: Diagnosis not present

## 2017-10-09 DIAGNOSIS — J309 Allergic rhinitis, unspecified: Secondary | ICD-10-CM | POA: Diagnosis not present

## 2017-10-09 DIAGNOSIS — Z6824 Body mass index (BMI) 24.0-24.9, adult: Secondary | ICD-10-CM | POA: Diagnosis not present

## 2017-10-09 DIAGNOSIS — F419 Anxiety disorder, unspecified: Secondary | ICD-10-CM | POA: Diagnosis not present

## 2018-01-07 ENCOUNTER — Ambulatory Visit: Payer: Self-pay | Admitting: Sports Medicine

## 2018-04-14 DIAGNOSIS — I10 Essential (primary) hypertension: Secondary | ICD-10-CM | POA: Diagnosis not present

## 2018-04-14 DIAGNOSIS — E785 Hyperlipidemia, unspecified: Secondary | ICD-10-CM | POA: Diagnosis not present

## 2018-04-14 DIAGNOSIS — E049 Nontoxic goiter, unspecified: Secondary | ICD-10-CM | POA: Diagnosis not present

## 2018-04-14 DIAGNOSIS — Z23 Encounter for immunization: Secondary | ICD-10-CM | POA: Diagnosis not present

## 2018-04-14 DIAGNOSIS — G43709 Chronic migraine without aura, not intractable, without status migrainosus: Secondary | ICD-10-CM | POA: Diagnosis not present

## 2018-11-05 DIAGNOSIS — I10 Essential (primary) hypertension: Secondary | ICD-10-CM | POA: Diagnosis not present

## 2018-11-05 DIAGNOSIS — G43709 Chronic migraine without aura, not intractable, without status migrainosus: Secondary | ICD-10-CM | POA: Diagnosis not present

## 2018-11-05 DIAGNOSIS — E049 Nontoxic goiter, unspecified: Secondary | ICD-10-CM | POA: Diagnosis not present

## 2018-11-05 DIAGNOSIS — E785 Hyperlipidemia, unspecified: Secondary | ICD-10-CM | POA: Diagnosis not present

## 2018-11-16 DIAGNOSIS — I10 Essential (primary) hypertension: Secondary | ICD-10-CM | POA: Diagnosis not present

## 2018-11-30 DIAGNOSIS — H00013 Hordeolum externum right eye, unspecified eyelid: Secondary | ICD-10-CM | POA: Diagnosis not present

## 2019-03-01 DIAGNOSIS — T50905A Adverse effect of unspecified drugs, medicaments and biological substances, initial encounter: Secondary | ICD-10-CM | POA: Diagnosis not present

## 2019-03-01 DIAGNOSIS — T7840XA Allergy, unspecified, initial encounter: Secondary | ICD-10-CM | POA: Diagnosis not present

## 2019-03-01 DIAGNOSIS — I10 Essential (primary) hypertension: Secondary | ICD-10-CM | POA: Diagnosis not present

## 2019-03-01 DIAGNOSIS — T6591XA Toxic effect of unspecified substance, accidental (unintentional), initial encounter: Secondary | ICD-10-CM | POA: Diagnosis not present

## 2019-03-09 DIAGNOSIS — L600 Ingrowing nail: Secondary | ICD-10-CM | POA: Diagnosis not present

## 2019-03-16 DIAGNOSIS — L820 Inflamed seborrheic keratosis: Secondary | ICD-10-CM | POA: Diagnosis not present

## 2019-03-16 DIAGNOSIS — D485 Neoplasm of uncertain behavior of skin: Secondary | ICD-10-CM | POA: Diagnosis not present

## 2019-03-16 DIAGNOSIS — L821 Other seborrheic keratosis: Secondary | ICD-10-CM | POA: Diagnosis not present

## 2019-03-16 DIAGNOSIS — L578 Other skin changes due to chronic exposure to nonionizing radiation: Secondary | ICD-10-CM | POA: Diagnosis not present

## 2019-03-23 DIAGNOSIS — H524 Presbyopia: Secondary | ICD-10-CM | POA: Diagnosis not present

## 2019-03-25 DIAGNOSIS — R51 Headache: Secondary | ICD-10-CM | POA: Diagnosis not present

## 2019-03-25 DIAGNOSIS — I160 Hypertensive urgency: Secondary | ICD-10-CM | POA: Diagnosis not present

## 2019-03-25 DIAGNOSIS — R079 Chest pain, unspecified: Secondary | ICD-10-CM | POA: Diagnosis not present

## 2019-03-25 DIAGNOSIS — I10 Essential (primary) hypertension: Secondary | ICD-10-CM | POA: Diagnosis not present

## 2019-03-25 DIAGNOSIS — R110 Nausea: Secondary | ICD-10-CM | POA: Diagnosis not present

## 2019-03-25 DIAGNOSIS — F419 Anxiety disorder, unspecified: Secondary | ICD-10-CM | POA: Diagnosis not present

## 2019-03-25 DIAGNOSIS — R002 Palpitations: Secondary | ICD-10-CM | POA: Diagnosis not present

## 2019-03-26 DIAGNOSIS — R079 Chest pain, unspecified: Secondary | ICD-10-CM | POA: Diagnosis not present

## 2019-03-26 DIAGNOSIS — I160 Hypertensive urgency: Secondary | ICD-10-CM | POA: Diagnosis not present

## 2019-03-27 DIAGNOSIS — I10 Essential (primary) hypertension: Secondary | ICD-10-CM | POA: Diagnosis not present

## 2019-03-27 DIAGNOSIS — R55 Syncope and collapse: Secondary | ICD-10-CM | POA: Diagnosis not present

## 2019-03-27 DIAGNOSIS — F419 Anxiety disorder, unspecified: Secondary | ICD-10-CM | POA: Diagnosis not present

## 2019-03-27 DIAGNOSIS — Z87891 Personal history of nicotine dependence: Secondary | ICD-10-CM | POA: Diagnosis not present

## 2019-03-27 DIAGNOSIS — Z79899 Other long term (current) drug therapy: Secondary | ICD-10-CM | POA: Diagnosis not present

## 2019-03-27 DIAGNOSIS — R079 Chest pain, unspecified: Secondary | ICD-10-CM | POA: Diagnosis not present

## 2019-03-27 DIAGNOSIS — R0602 Shortness of breath: Secondary | ICD-10-CM | POA: Diagnosis not present

## 2019-03-27 DIAGNOSIS — R202 Paresthesia of skin: Secondary | ICD-10-CM | POA: Diagnosis not present

## 2019-03-27 DIAGNOSIS — R002 Palpitations: Secondary | ICD-10-CM | POA: Diagnosis not present

## 2019-03-27 DIAGNOSIS — R000 Tachycardia, unspecified: Secondary | ICD-10-CM | POA: Diagnosis not present

## 2019-03-27 DIAGNOSIS — R42 Dizziness and giddiness: Secondary | ICD-10-CM | POA: Diagnosis not present

## 2019-03-28 DIAGNOSIS — R55 Syncope and collapse: Secondary | ICD-10-CM | POA: Diagnosis not present

## 2019-03-28 DIAGNOSIS — R002 Palpitations: Secondary | ICD-10-CM | POA: Diagnosis not present

## 2019-03-28 DIAGNOSIS — Z87891 Personal history of nicotine dependence: Secondary | ICD-10-CM | POA: Diagnosis not present

## 2019-03-28 DIAGNOSIS — I10 Essential (primary) hypertension: Secondary | ICD-10-CM | POA: Diagnosis not present

## 2019-03-28 DIAGNOSIS — F419 Anxiety disorder, unspecified: Secondary | ICD-10-CM | POA: Diagnosis not present

## 2019-03-28 DIAGNOSIS — R0789 Other chest pain: Secondary | ICD-10-CM | POA: Diagnosis not present

## 2019-03-29 DIAGNOSIS — R55 Syncope and collapse: Secondary | ICD-10-CM | POA: Diagnosis not present

## 2019-03-29 DIAGNOSIS — R079 Chest pain, unspecified: Secondary | ICD-10-CM | POA: Diagnosis not present

## 2019-03-29 DIAGNOSIS — F419 Anxiety disorder, unspecified: Secondary | ICD-10-CM | POA: Diagnosis not present

## 2019-03-29 DIAGNOSIS — R0789 Other chest pain: Secondary | ICD-10-CM | POA: Diagnosis not present

## 2019-03-29 DIAGNOSIS — I10 Essential (primary) hypertension: Secondary | ICD-10-CM | POA: Diagnosis not present

## 2019-03-29 DIAGNOSIS — R002 Palpitations: Secondary | ICD-10-CM | POA: Diagnosis not present

## 2019-04-06 DIAGNOSIS — I10 Essential (primary) hypertension: Secondary | ICD-10-CM | POA: Diagnosis not present

## 2019-04-06 DIAGNOSIS — F419 Anxiety disorder, unspecified: Secondary | ICD-10-CM | POA: Diagnosis not present

## 2019-04-23 DIAGNOSIS — R002 Palpitations: Secondary | ICD-10-CM | POA: Diagnosis not present

## 2019-04-25 DIAGNOSIS — I471 Supraventricular tachycardia: Secondary | ICD-10-CM | POA: Diagnosis not present

## 2019-05-09 DIAGNOSIS — Z20828 Contact with and (suspected) exposure to other viral communicable diseases: Secondary | ICD-10-CM | POA: Diagnosis not present

## 2020-08-28 ENCOUNTER — Other Ambulatory Visit (HOSPITAL_COMMUNITY)
Admission: RE | Admit: 2020-08-28 | Discharge: 2020-08-28 | Disposition: A | Discharge status: Home / Self Care | Payer: BLUE CROSS/BLUE SHIELD | Source: Ambulatory Visit | Attending: Pathology | Admitting: Pathology

## 2020-08-30 LAB — SURGICAL PATHOLOGY

## 2021-04-24 ENCOUNTER — Other Ambulatory Visit: Payer: Self-pay | Admitting: Nurse Practitioner

## 2021-04-24 DIAGNOSIS — G518 Other disorders of facial nerve: Secondary | ICD-10-CM

## 2021-04-24 DIAGNOSIS — G500 Trigeminal neuralgia: Secondary | ICD-10-CM

## 2021-05-19 ENCOUNTER — Ambulatory Visit
Admission: RE | Admit: 2021-05-19 | Discharge: 2021-05-19 | Disposition: A | Discharge status: Home / Self Care | Payer: BLUE CROSS/BLUE SHIELD | Source: Ambulatory Visit | Attending: Nurse Practitioner | Admitting: Nurse Practitioner

## 2021-05-19 DIAGNOSIS — G500 Trigeminal neuralgia: Secondary | ICD-10-CM

## 2021-05-19 DIAGNOSIS — G518 Other disorders of facial nerve: Secondary | ICD-10-CM

## 2021-05-19 MED ORDER — GADOBENATE DIMEGLUMINE 529 MG/ML IV SOLN
15.0000 mL | Freq: Once | INTRAVENOUS | Status: AC | PRN
  Administered 2021-05-19: 15 mL via INTRAVENOUS

## 2021-05-28 ENCOUNTER — Encounter: Payer: Self-pay | Admitting: Neurology

## 2021-05-28 ENCOUNTER — Ambulatory Visit (INDEPENDENT_AMBULATORY_CARE_PROVIDER_SITE_OTHER): Payer: Medicare Other | Admitting: Neurology

## 2021-05-28 VITALS — Ht 67.0 in | Wt 162.0 lb

## 2021-05-28 DIAGNOSIS — R519 Headache, unspecified: Secondary | ICD-10-CM | POA: Diagnosis not present

## 2021-05-28 DIAGNOSIS — R9089 Other abnormal findings on diagnostic imaging of central nervous system: Secondary | ICD-10-CM | POA: Insufficient documentation

## 2021-05-28 DIAGNOSIS — Z8619 Personal history of other infectious and parasitic diseases: Secondary | ICD-10-CM | POA: Diagnosis not present

## 2021-05-28 MED ORDER — VALACYCLOVIR HCL 1 G PO TABS: 1000.0000 mg | ORAL_TABLET | Freq: Three times a day (TID) | ORAL | 0 refills | Status: DC

## 2021-05-28 MED ORDER — GABAPENTIN 300 MG PO CAPS: 600.0000 mg | ORAL_CAPSULE | Freq: Three times a day (TID) | ORAL | 6 refills | Status: DC

## 2021-05-28 NOTE — Progress Notes (Signed)
Chief Complaint  Patient presents with   New Patient (Initial Visit)    With husband, room 13,NP/Paper/5 points Med/Tara Doree Fudge NP [phone] neuralgia/ Today c/o right face pain       ASSESSMENT AND PLAN  Alexa Mora is a 65 y.o. female   Right facial sensitivity, involving right V2 V3 branch,  History are not typical for trigeminal neuralgia,  She complains of skin sensitivity, concerned about the possibility shingles  Laboratory evaluations, including varicella-zoster IgM antibody  Gabapentin higher dose 300 mg up to 2 tablets 3 times a day  DIAGNOSTIC DATA (LABS, IMAGING, TESTING) - I reviewed patient records, labs, notes, testing and imaging myself where available.   MEDICAL HISTORY:  Alexa Mora, is a 65 year old female, seen in request by her primary care nurse practitioner Alexa Mora, for evaluation of right facial pain, initial evaluation was on May 28, 2021  I reviewed and summarized the referring note. PMHX HTN HLD  Since August 2022, she had intermittent right ear numbness tingling, sensitive to touch, it is hard for her to tolerate the glasses sitting on her right ear, very irritable, sometimes radiating discomfort to her right chin, with occasionally sharp electronic radiating pain lasting for few seconds, but denies difficulty chewing swallowing, no rash broke out,  She was seen by dentist, no clear etiology found  She did have a history of chickenpox when she was younger, personally reviewed MRI of the brain with without contrast May 19, 2021, abnormal enlarged, edematous, enhancing V3 segment of the right trigeminal nerve,    PHYSICAL EXAM:   Vitals:   05/28/21 1404  Weight: 162 lb (73.5 kg)  Height: 5\' 7"  (1.702 m)   Not recorded     Body mass index is 25.37 kg/m.  PHYSICAL EXAMNIATION:  Gen: NAD, conversant, well nourised, well groomed                     Cardiovascular: Regular rate rhythm, no  peripheral edema, warm, nontender. Eyes: Conjunctivae clear without exudates or hemorrhage Neck: Supple, no carotid bruits. Pulmonary: Clear to auscultation bilaterally   NEUROLOGICAL EXAM:  MENTAL STATUS: Speech:    Speech is normal; fluent and spontaneous with normal comprehension.  Cognition:     Orientation to time, place and person     Normal recent and remote memory     Normal Attention span and concentration     Normal Language, naming, repeating,spontaneous speech     Fund of knowledge   CRANIAL NERVES: CN II: Visual fields are full to confrontation. Pupils are round equal and briskly reactive to light. CN III, IV, VI: extraocular movement are normal. No ptosis. CN V: Facial sensation is intact to light touch, bilateral corneal reflex were intact CN VII: Face is symmetric with normal eye closure  CN VIII: Hearing is normal to causal conversation. CN IX, X: Phonation is normal. CN XI: Head turning and shoulder shrug are intact  MOTOR: There is no pronator drift of out-stretched arms. Muscle bulk and tone are normal. Muscle strength is normal.  REFLEXES: Reflexes are 2+ and symmetric at the biceps, triceps, knees, and ankles. Plantar responses are flexor.  SENSORY: Intact to light touch, pinprick and vibratory sensation are intact in fingers and toes.  COORDINATION: There is no trunk or limb dysmetria noted.  GAIT/STANCE: Posture is normal. Gait is steady with normal steps, base, arm swing, and turning. Heel and toe walking are normal. Tandem gait is normal.  Romberg is  absent.  REVIEW OF SYSTEMS:  Full 14 system review of systems performed and notable only for as above All other review of systems were negative.   ALLERGIES: Allergies  Allergen Reactions   Sulfa Antibiotics     HOME MEDICATIONS: Current Outpatient Medications  Medication Sig Dispense Refill   amLODipine (NORVASC) 10 MG tablet Take 10 mg by mouth daily.     aspirin 81 MG tablet Take 81 mg  by mouth daily.     cholecalciferol (VITAMIN D) 1000 UNITS tablet Take 2,000 Units by mouth daily.     diazepam (VALIUM) 5 MG tablet Take 1 tablet (5 mg total) by mouth once. 1 tablet 0   diphenhydrAMINE (BENADRYL) 25 MG tablet Take 25 mg by mouth every 6 (six) hours as needed.     hydrochlorothiazide (MICROZIDE) 12.5 MG capsule Take by mouth.     metoprolol succinate (TOPROL-XL) 25 MG 24 hr tablet Take 25 mg by mouth 2 (two) times daily.     omeprazole (PRILOSEC) 10 MG capsule Take 20 mg by mouth daily.     No current facility-administered medications for this visit.    PAST MEDICAL HISTORY: Past Medical History:  Diagnosis Date       Family history of malignant neoplasm of breast    GERD (gastroesophageal reflux disease)    Hypertension    Migraine aura, persistent, intractable     PAST SURGICAL HISTORY: History reviewed. No pertinent surgical history.  FAMILY HISTORY: Family History  Problem Relation Age of Onset   Breast cancer Sister 50       currently 30   Breast cancer Cousin        Age dx?; currently 77; paternal first-cousin    SOCIAL HISTORY: Social History   Socioeconomic History   Marital status: Married    Spouse name: Not on file   Number of children: Not on file   Years of education: Not on file   Highest education level: Not on file  Occupational History   Not on file  Tobacco Use   Smoking status: Some Days    Years: 30.00    Types: Cigarettes   Smokeless tobacco: Never  Substance and Sexual Activity   Alcohol use: No   Drug use: No   Sexual activity: Not on file  Other Topics Concern   Not on file  Social History Narrative   Not on file   Social Determinants of Health   Financial Resource Strain: Not on file  Food Insecurity: Not on file  Transportation Needs: Not on file  Physical Activity: Not on file  Stress: Not on file  Social Connections: Not on file  Intimate Partner Violence: Not on file      Marcial Pacas, M.D.  Ph.D.  [facility], [facility] [address], [place], [place] [address] Ph: [phone] Fax: [phone]  CC:  Alexa Court, NP [address] [place],  [place] [address]  Alexa Court, NP

## 2021-05-29 ENCOUNTER — Telehealth: Payer: Self-pay | Admitting: Neurology

## 2021-05-29 LAB — COMPREHENSIVE METABOLIC PANEL
ALT: 14 IU/L (ref 0–32)
AST: 18 IU/L (ref 0–40)
Albumin/Globulin Ratio: 1.4 (ref 1.2–2.2)
Albumin: 4.6 g/dL (ref 3.8–4.8)
Alkaline Phosphatase: 91 IU/L (ref 44–121)
BUN/Creatinine Ratio: 17 (ref 12–28)
BUN: 14 mg/dL (ref 8–27)
Bilirubin Total: 0.2 mg/dL (ref 0.0–1.2)
CO2: 27 mmol/L (ref 20–29)
Calcium: 10.1 mg/dL (ref 8.7–10.3)
Chloride: 101 mmol/L (ref 96–106)
Creatinine, Ser: 0.84 mg/dL (ref 0.57–1.00)
Globulin, Total: 3.2 g/dL (ref 1.5–4.5)
Glucose: 94 mg/dL (ref 70–99)
Potassium: 3.9 mmol/L (ref 3.5–5.2)
Sodium: 142 mmol/L (ref 134–144)
Total Protein: 7.8 g/dL (ref 6.0–8.5)
eGFR: 77 mL/min/{1.73_m2} (ref >59)

## 2021-05-29 LAB — TSH: TSH: 1.37 u[IU]/mL (ref 0.450–4.500)

## 2021-05-29 LAB — CBC WITH DIFFERENTIAL/PLATELET
Basophils Absolute: 0.1 10*3/uL (ref 0.0–0.2)
Basos: 1 %
EOS (ABSOLUTE): 0.2 10*3/uL (ref 0.0–0.4)
Eos: 2 %
Hematocrit: 39.6 % (ref 34.0–46.6)
Hemoglobin: 13.6 g/dL (ref 11.1–15.9)
Immature Grans (Abs): 0 10*3/uL (ref 0.0–0.1)
Immature Granulocytes: 0 %
Lymphocytes Absolute: 5.8 10*3/uL — ABNORMAL HIGH (ref 0.7–3.1)
Lymphs: 49 %
MCH: 30.3 pg (ref 26.6–33.0)
MCHC: 34.3 g/dL (ref 31.5–35.7)
MCV: 88 fL (ref 79–97)
Monocytes Absolute: 0.8 10*3/uL (ref 0.1–0.9)
Monocytes: 7 %
Neutrophils Absolute: 4.8 10*3/uL (ref 1.4–7.0)
Neutrophils: 41 %
Platelets: 331 10*3/uL (ref 150–450)
RBC: 4.49 x10E6/uL (ref 3.77–5.28)
RDW: 13.6 % (ref 11.7–15.4)
WBC: 11.7 10*3/uL — ABNORMAL HIGH (ref 3.4–10.8)

## 2021-05-29 LAB — HGB A1C W/O EAG: Hgb A1c MFr Bld: 5.1 % (ref 4.8–5.6)

## 2021-05-29 LAB — SEDIMENTATION RATE: Sed Rate: 37 mm/hr (ref 0–40)

## 2021-05-29 LAB — C-REACTIVE PROTEIN: CRP: 2 mg/L (ref 0–10)

## 2021-05-29 LAB — VARICELLA ZOSTER ANTIBODY, IGG: Varicella zoster IgG: 890 index (ref >165)

## 2021-05-29 LAB — VARICELLA ZOSTER ANTIBODY, IGM: Varicella IgM: <0.91 index (ref 0.00–0.90)

## 2021-05-29 NOTE — Telephone Encounter (Addendum)
Pt aware of the lab results. Reports having a infection in her right ear the beginning of October 2022. She was placed on antibiotic. However, she has noticed it still feels fullness with pain. She will follow up with her PCP to be sure the infection is cleared.

## 2021-05-29 NOTE — Telephone Encounter (Signed)
Laboratory evaluation showed mild elevated WBC 11.7, please check with her if she has any active signs of infection, such as UTI, upper respiratory infection,  Rest of the laboratory evaluation showed no significant abnormalities,

## 2021-06-12 ENCOUNTER — Telehealth: Payer: Self-pay | Admitting: Diagnostic Neuroimaging

## 2021-06-12 NOTE — Telephone Encounter (Signed)
This is a Dr. Krista Blue pt, I accidentally put Dr. Leta Baptist.

## 2021-06-12 NOTE — Telephone Encounter (Signed)
I spoke to the patient. She has a prescription for gabapentin 300mg , two capsules TID. Right now, she is only taking two capsules at bedtime. She has not tried to increase her dosage. She was worried about feeling sleeping during the day. I recommended she try additional medication to help alleviate her worsening pain. She can slowly titrate up the amount to minimize the risk of side effects. She is agreeable to this plan. She will call us back for continued discomfort or further concerns.

## 2021-06-12 NOTE — Telephone Encounter (Signed)
Pt called states she is still having her head pains and not sure what she should do. She has stopped taking her medication . Pt requesting a call back.

## 2021-06-26 NOTE — Telephone Encounter (Signed)
Pt called says she still has an unbearable neve pain where the medication does not help and she wants to know what she should do. Pt requesting a call back.

## 2021-06-26 NOTE — Telephone Encounter (Signed)
I called patient. She reports that she developed a large pimple on her right chin that came to a head. She popped it and clear fluid with some white colored pus was discharged. Since then, she has had a shooting/achy pain several times a day that the gabapentin does not touch. She reports that Dr. Krista Blue told her that she may have shingles and she is wondering if this pimple is shingles. She denies having a rash. She is also taking daily tylenol to help with the pain and the patient understands that taking tylenol is not healthy for her.  She would like to know if this is shingles and if there is anything else she can do for this facial pain. She reports taking gabapentin 600mg  TID.

## 2021-06-27 MED ORDER — GABAPENTIN 300 MG PO CAPS: 600.0000 mg | ORAL_CAPSULE | Freq: Four times a day (QID) | ORAL | 5 refills | Status: DC

## 2021-06-27 NOTE — Telephone Encounter (Signed)
I spoke to the patient. Reviewed the plan below. She verbalized understanding and was in agreement to try the increased dose of gabapentin. New rx sent to pharmacy. She will call back with any further concerns.

## 2021-06-27 NOTE — Addendum Note (Signed)
Addended by: Noberto Retort C on: 06/27/2021 04:22 PM   Modules accepted: Orders

## 2021-06-27 NOTE — Telephone Encounter (Signed)
The description is most consistent with bacterial infection,   Laboratory evaluation in November 2022, positive IgG consistent with history of vaccination, negative IgM, no evidence of active shingles infection  She may try higher dose of gabapentin up to 600 mg 4 times a day

## 2021-06-29 ENCOUNTER — Ambulatory Visit: Payer: Medicare Other | Admitting: Neurology

## 2021-07-10 ENCOUNTER — Telehealth: Payer: Self-pay | Admitting: Neurology

## 2021-07-10 DIAGNOSIS — R519 Headache, unspecified: Secondary | ICD-10-CM

## 2021-07-10 MED ORDER — OXCARBAZEPINE 150 MG PO TABS: 300.0000 mg | ORAL_TABLET | Freq: Two times a day (BID) | ORAL | 11 refills | Status: DC

## 2021-07-10 NOTE — Telephone Encounter (Signed)
I spoke to the patient. She was able to get a 30-day supply for $43.00 by using a goodrx card. She may be in the coverage gap since it is December. I told her to please let me know if the cost is too high at the next refill. In checking goodrx, [facility] was the least expensive if she continues the medication and cannot get coverage through her insurance plan.

## 2021-07-10 NOTE — Telephone Encounter (Signed)
Pt has called to report her facial pain facial pain is worsening and she can't handle this pain.  Please call pt to discuss.

## 2021-07-10 NOTE — Telephone Encounter (Addendum)
Please call patient, the location and description of the pain, consistent with right trigeminal nerve irritation,  But has other atypical features for trigeminal neuralgia, such as started with sensory change at right V3 territory, MRI showed enhancing V3 segment,  Differentiation diagnosis including post herpetic neuralgia and V3 territory, idiopathic trigeminal neuropathy  Within normal limit IGM in Nov does not rule out the possibility of shingles (symptom onset was in August 2022),  After VZV infection, IgG and IgM antibodies appear 2 to 5 days after the rash and show the highest titers at 2 to 3 weeks. The VZV IgM antibody levels then rapidly decrease and cannot be detected at 1 year after infection, and the IgG antibody levels gradually decrease, showing positive test results for several year  If gabapentin was helping her right facial pain some, may consider adding second agent Trileptal 150 mg twice a day, titrating to 300 mg twice a day, the combination therapy as potential increased side effects such as dizziness, sleepiness, dry mouth, If she think gabapentin does not help her much at all, may consider switch gabapentin to Lyrica   The International Classification of Headache Disorders, Third Edition (ICHD-3) diagnostic criteria for TN are as follows [1]: ?A) Recurrent paroxysms of unilateral facial pain in the distribution(s) of one or more divisions of the trigeminal nerve, with no radiation beyond, and fulfilling criteria B and C ?B) Pain has all of the following characteristics: Lasting from a fraction of a second to two minutes Severe intensity Electric shock-like, shooting, stabbing, or sharp in quality ?C) Precipitated by innocuous stimuli within the affected trigeminal distribution

## 2021-07-10 NOTE — Telephone Encounter (Signed)
I spoke to the patient. Reports having daily, intermittent episodes of intolerable, right-sided facial pain. She is currently taking gabapentin 300mg , two capsules four times daily. She would like an alternative treatment that may offer better control of symptoms. Also, she would like a better understanding of her diagnosis.

## 2021-07-10 NOTE — Telephone Encounter (Signed)
Pt called asking if she can have a 30 day supply instead of a 90 day supply for the OXcarbazepine (TRILEPTAL) 150 MG tablet. Pt said the 90 day supply is $300 and that cost too much. Pt requesting a call back.

## 2021-07-10 NOTE — Telephone Encounter (Signed)
Meds ordered this encounter  Medications   OXcarbazepine (TRILEPTAL) 150 MG tablet    Sig: Take 2 tablets (300 mg total) by mouth 2 (two) times daily.    Dispense:  120 tablet    Refill:  11     I have called patient, she still has mild to moderate facial pain despite gabapentin 300 mg 2 tablets 4 times a day,  Add on Trileptal 150 mg twice a day, may titrating to 2 tablets twice a day

## 2021-07-10 NOTE — Addendum Note (Signed)
Addended by: Marcial Pacas on: 07/10/2021 02:45 PM   Modules accepted: Orders

## 2021-07-10 NOTE — Telephone Encounter (Signed)
I called the patient and discussed the information below. She understands that Dr. Rhea Belton plan is to focus on pain control. Feels the burning sensations have improved with gabapentin. However, the medication is not helping with episodes of intermittent, debilitating pain that is still occurring daily.

## 2021-07-11 ENCOUNTER — Other Ambulatory Visit: Payer: Self-pay | Admitting: Neurology

## 2021-07-11 DIAGNOSIS — R519 Headache, unspecified: Secondary | ICD-10-CM

## 2021-07-11 MED ORDER — PREGABALIN 150 MG PO CAPS: 150.0000 mg | ORAL_CAPSULE | Freq: Four times a day (QID) | ORAL | 5 refills | Status: DC

## 2021-07-11 NOTE — Telephone Encounter (Signed)
I spoke to the patient. Oxcarbazepine 150mg  was added to her treatment regimen. She took her first dose of the medication last evening. She started feeling itchy all over then woke up this morning with reddened skin, especially her face. She has continued to itch. She has not taken any further doses of medication. She is using Benadryl to help counteract the symptoms. I told her to also increase her water intake to help flush it out of her system. It has been added to her allergy list. [facility] and [facility] remaining [facility] on [facility].  She is still in pain and would like an alternate plan.

## 2021-07-11 NOTE — Telephone Encounter (Signed)
PA for pregabalin 150mg  started on covermymeds (key: [id number]). Pt has pharmacy coverage through [employer]. Decision pending. If denied, she has a goodrx coupon to use that will make the medication affordable.

## 2021-07-11 NOTE — Telephone Encounter (Signed)
Because she is in significant facial pain,  Will not tapering down gabapentin slowly,  Starting from tomorrow July 12, 2021, stopped taking gabapentin,  Starting Lyrica 150 mg 4 times a day  Meds ordered this encounter  Medications   pregabalin (LYRICA) 150 MG capsule    Sig: Take 1 capsule (150 mg total) by mouth 4 (four) times daily.    Dispense:  120 capsule    Refill:  5

## 2021-07-11 NOTE — Telephone Encounter (Signed)
Paged at 7:45. She is "itchy all over from a new medication" Patient of Dr. Krista Blue. Please call.

## 2021-07-11 NOTE — Telephone Encounter (Signed)
Pt called stating that she would like the pregabalin (LYRICA) 150 MG capsule RX to [facility] and have the RX sent to the [facility] in [place].

## 2021-07-11 NOTE — Telephone Encounter (Addendum)
I spoke to the patient. She is agreeable to this plan. She is going [facility] to make sure she can use her insurance. If not, then the least expensive pharmacy will be [facility] (#120 capsules will be between $14.81-$18.31). As a back-up plan, I added her local [facility] and provided her with the goodrx coupon information. She will call me back if the prescription needs to be sent elsewhere.  The gabapentin refills have been [facility]ed [facility] to avoid confusion.

## 2021-07-11 NOTE — Telephone Encounter (Signed)
I called [facility] and spoke to [place]. The medication is now processing through her insurance plan and the co-pay is $61.14. I spoke to Alexa Mora again and she is going to pick it up today. She will start the new medication tomorrow.

## 2021-07-11 NOTE — Telephone Encounter (Signed)
[employer] [facility] [employer] plan. PK#[id number]. PA approved through 07/11/2022. Call [phone], if customer service is needed.

## 2021-07-11 NOTE — Telephone Encounter (Signed)
Pt called asking for the prescription to be sent to [id number] - , [place]

## 2021-07-11 NOTE — Addendum Note (Signed)
Addended by: Marcial Pacas on: 07/11/2021 09:30 AM   Modules accepted: Orders

## 2021-07-11 NOTE — Addendum Note (Signed)
Addended by: Noberto Retort C on: 07/11/2021 09:04 AM   Modules accepted: Orders

## 2021-07-11 NOTE — Addendum Note (Signed)
Addended by: Noberto Retort C on: 07/11/2021 10:16 AM   Modules accepted: Orders

## 2021-07-11 NOTE — Addendum Note (Signed)
Addended by: Desmond Lope on: 07/11/2021 09:53 AM   Modules accepted: Orders

## 2021-07-26 ENCOUNTER — Telehealth: Payer: Self-pay | Admitting: Neurology

## 2021-07-26 NOTE — Telephone Encounter (Signed)
Pt believes her blood pressure medication amLODipine (NORVASC) 10 MG tablet(based on research she has done), is causing her Trigeminal neuralgia.  Pt is asking for a call to see what Sharyn Lull, RN has to say about that.  Please call.  Pt is aware the office is not open on 07-27-21

## 2021-07-30 NOTE — Telephone Encounter (Signed)
Left detailed message on her voicemail (ok per DPR) with Dr. Rhea Belton reply. Provided our number to call back with any questions.

## 2021-07-30 NOTE — Telephone Encounter (Signed)
I do not think amlodipine would cause trigeminal neuralgia, please see below information from [employer]  Amlodipine Dizziness Extrapyramidal disease Somnolence Dizziness a) Incidence: 1.1% to 3.4% [2] b) Adult and Pediatric Clinical Trials  1) Multiple indications (oral route): 1.1% (2.5 mg; n=275), 3.4% (5 mg; n=296) and 3.4% (10 mg; n=268) with amlodipine vs 1.5% with placebo (n=520) [2]  Extrapyramidal disease a) Postmarketing  1) Has been reported during postmarketing surveillance [2]  Somnolence a) Incidence: 1.4% [2] b) Adult and Pediatric Clinical Trials  1) Multiple indications (oral route): 1.4% with amlodipine (n=1730) vs 0.6% with placebo (n=1250) [2]

## 2021-08-01 ENCOUNTER — Encounter: Payer: Self-pay | Admitting: Adult Health

## 2021-08-01 ENCOUNTER — Ambulatory Visit: Payer: Medicare HMO | Admitting: Adult Health

## 2021-08-01 VITALS — BP 146/74 | HR 69 | Ht 67.0 in | Wt 174.0 lb

## 2021-08-01 DIAGNOSIS — G509 Disorder of trigeminal nerve, unspecified: Secondary | ICD-10-CM | POA: Diagnosis not present

## 2021-08-01 DIAGNOSIS — R519 Headache, unspecified: Secondary | ICD-10-CM

## 2021-08-01 NOTE — Progress Notes (Signed)
[facility] [address] [place]. [place] [address] [phone]       OFFICE FOLLOW UP NOTE  Ms. Alexa Mora Date of Birth:  1956/03/22 Medical Record Number:  [id number]    Primary neurologist: Dr. Krista Mora Reason for visit: right facial pain    SUBJECTIVE:   CHIEF COMPLAINT:  Chief Complaint  Patient presents with   Follow-up    Rm 3 with husband. Here for follow up on right sided facial pain. Reports sx are the name no improvement noted.     HPI:   Update 08/01/2021 JM: returns for 2 month follow up after seeing Dr. Krista Mora for right facial symptoms of unknown etiology. She is accompanied by her husband.   Multiple telephone messages on epic re: continued and/or worsening right facial pain. Despite high dose gabapentin (600mg  4x daily), pain persisted therefore transition to pregabalin currently taking 150 mg 4 times daily. Attempted to start Trileptal but reports allergic reaction (swelling and itching of throat).   Reports improvement of constant pain since starting Lyrica but continues to experience intermittent severe sharp shooting pains lasting short duration although debilitating, occurs at least 7x per day. Pain will feel better with heat pack and pressing hand to face. Avoids hard foods and cold weather from hitting her face as she was told by her PCP that these could make her pain worse so she ensures she avoids (although has not actually tested to see if this makes pain worse).  Symptoms persist within the V3 nerve distrubution (reports teeth pain, numbness down jaw to upper and bottom lip, and numbness right half of tongue, occasional ear pain and possibly some decreased hearing). Plans to speak with PCP regarding ENT referral. She stopped amlodipine 4 days ago as she is concerned this medication is causing her facial pain - does believe that the sharp shooting pains are not as sharp.      Consult visit 05/28/2021 Dr. Krista Mora: Alexa Mora, is a  66 year old female, seen in request by her primary care nurse practitioner Alexa Mora, for evaluation of right facial pain, initial evaluation was on May 28, 2021   I reviewed and summarized the referring note. 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  Social History:  Social History   Socioeconomic History   Marital status: Married    Spouse name: Not on file   Number of children: Not on file   Years of education: Not  on file   Highest education level: Not on file  Occupational History   Not on file  Tobacco Use   Smoking status: Some Days    Years: 30.00    Types: Cigarettes   Smokeless tobacco: Never  Substance and Sexual Activity   Alcohol use: No   Drug use: No   Sexual activity: Not on file  Other Topics Concern   Not on file  Social History Narrative   Not on file   Social Determinants of Health   Financial Resource Strain: Not on file  Food Insecurity: Not on file   Transportation Needs: Not on file  Physical Activity: Not on file  Stress: Not on file  Social Connections: Not on file  Intimate Partner Violence: Not on file    Family History:  Family History  Problem Relation Age of Onset   Breast cancer Sister 45       currently 14   Breast cancer Cousin        Age dx?; currently 78; paternal first-cousin    Medications:   Current Outpatient Medications on File Prior to Visit  Medication Sig Dispense Refill   aspirin 81 MG tablet Take 81 mg by mouth daily.     atorvastatin (LIPITOR) 10 MG tablet Take 10 mg by mouth daily.     cholecalciferol (VITAMIN D) 1000 UNITS tablet Take 2,000 Units by mouth daily.     diphenhydrAMINE (BENADRYL) 25 MG tablet Take 25 mg by mouth every 6 (six) hours as needed.     hydrochlorothiazide (MICROZIDE) 12.5 MG capsule Take by mouth.     metoprolol succinate (TOPROL-XL) 25 MG 24 hr tablet Take 25 mg by mouth 2 (two) times daily.     pregabalin (LYRICA) 150 MG capsule Take 1 capsule (150 mg total) by mouth 4 (four) times daily. 120 capsule 5   valACYclovir (VALTREX) 1000 MG tablet Take 1 tablet (1,000 mg total) by mouth 3 (three) times daily. 21 tablet 0   No current facility-administered medications on file prior to visit.    Allergies:   Allergies  Allergen Reactions   Oxcarbazepine Itching    Red, itchy skin   Sulfa Antibiotics       OBJECTIVE:  Physical Exam  Vitals:   08/01/21 1406 08/01/21 1510  BP: (!) 162/84 (!) 146/74  Pulse: 68 69  SpO2: 98%   Weight: 174 lb (78.9 kg)   Height: 5\' 7"  (1.702 m)    Body mass index is 27.25 kg/m. No results found.  General: well developed, well nourished, very pleasant middle-aged African-American female, seated, in no evident distress Head: head normocephalic and atraumatic.   Neck: supple with no carotid or supraclavicular bruits Cardiovascular: regular rate and rhythm, no murmurs Musculoskeletal: no deformity Skin:  no rash/petichiae Vascular:   Normal pulses all extremities   Neurologic Exam Mental Status: Awake and fully alert. Oriented to place and time. Recent and remote memory intact. Attention span, concentration and fund of knowledge appropriate. Mood and affect appropriate.  Cranial Nerves: Pupils equal, briskly reactive to light. Extraocular movements full without nystagmus. Visual fields full to confrontation. Hearing intact. Face, tongue, palate moves normally and symmetrically. Facial sensation decreased to pinprick and light touch on R V3 nerve distribution and mildly impaired V2 distribution Motor: Normal bulk and tone. Normal strength in all tested extremity muscles Sensory.: intact to touch , pinprick , position and vibratory sensation in all tested extremities Coordination: Rapid alternating movements normal in all extremities. Finger-to-nose and heel-to-shin  performed accurately bilaterally. Gait and Station: Arises from chair without difficulty. Stance is normal. Gait demonstrates normal stride length and balance without use of AD.  Reflexes: 1+ and symmetric. Toes downgoing.     ENT?  Maxillary imaging? Additional medications?      ASSESSMENT/PLAN: Alexa Mora is a 66 y.o. year old female with right facial sensitivity and dysthesia's  involving R V2 and V3 branch (per Dr. Krista Mora, DDx includes post herpetic neuralgia vs V3 territory idiopathic TN). Continued sharp shooting pains multiple times during the day. Improvement of constant pain on Lyrica.      Right facial pain, unknown etiology Abnormality of trigeminal nerve Unknown exact cause - long discussion regarding this concern Will further discuss need of imaging/work up from our standpoint with Dr. Krista Mora  Agree with ENT evaluation - offered referral but patient wishes to obtain this from PCP Continue pregabalin 150mg  four times daily Consider adding lamotrigine but patient wishes to wait until at least next week (to allow time for amlodipine to get  out of her system as she feels this medication is related - again, discussed low suspicion amlodipine related to symptoms which was also previously discussed with Dr. Krista Mora) Allergic reaction to oxcarbazepine therefore hesitant to trial carbamazepine No benefit with high dose gabapentin    Follow up in 3 months with Dr. Krista Mora or call earlier if needed   CC:  [place] provider: Dr. Krista Mora PCP: Clancy Gourd, NP    I spent 46 minutes of face-to-face and non-face-to-face time with patient and husband.  This included previsit chart review, lab review, study review, electronic health record documentation, patient and husband education and prolonged discussion regarding right facial pain and possible etiologies, further treatment options and further evaluation and answered all other multiple questions to patient and husband satisfaction  Frann Rider, AGNP-BC  [facility] [address] [place], [place] [address]  Phone [phone] Fax [phone] Note: This document was prepared with digital dictation and possible smart phrase technology. Any transcriptional errors that result from this process are unintentional.

## 2021-08-01 NOTE — Patient Instructions (Addendum)
Your Plan:   May consider starting Lamictal if needed - please let us know next week if you would like to start  Continue lyrica 150mg  4 times daily  I will further discuss if any further work up or imaging is needed from our standpoint  Discuss evaluation by ENT with your PCP    Follow up with Dr. Krista Blue in 3 months or call earlier if needed       Thank you for coming to see [place] at [facility]. I hope we have been able to provide you high quality care today.  You may receive a patient satisfaction survey over the next few weeks. We would appreciate your feedback and comments so that we may continue to improve ourselves and the health of our patients.

## 2021-08-03 NOTE — Progress Notes (Signed)
Chart reviewed, agree above plan Follow up with me is scheduled for April 2023

## 2021-10-30 ENCOUNTER — Ambulatory Visit: Payer: Medicare HMO | Admitting: Neurology

## 2021-10-30 ENCOUNTER — Encounter: Payer: Self-pay | Admitting: Neurology

## 2021-10-30 VITALS — BP 161/80 | HR 70 | Ht 67.0 in | Wt 174.0 lb

## 2021-10-30 DIAGNOSIS — G509 Disorder of trigeminal nerve, unspecified: Secondary | ICD-10-CM | POA: Insufficient documentation

## 2021-10-30 DIAGNOSIS — R519 Headache, unspecified: Secondary | ICD-10-CM | POA: Diagnosis not present

## 2021-10-30 MED ORDER — PREGABALIN 150 MG PO CAPS: 150.0000 mg | ORAL_CAPSULE | Freq: Three times a day (TID) | ORAL | 5 refills | Status: DC

## 2021-10-30 NOTE — Progress Notes (Signed)
?[facility] ?[address] ?[place]. [place] [address] ?[phone] ? ?     OFFICE FOLLOW UP NOT ? ?Assessment plan: ?Right trigeminal pain ? Personally reviewed MRI of brain with without contrast in November 2022, did show enlarged edematous enhancing right trigeminal nerve ? Her symptoms overall has much improved, refilled her Lyrica 150 mg 3 times a day ? She did have a history of chickenpox in the past, varicella-zoster IgG antibody was positive, IgM was negative, ? Will follow-up in 6 months, if her symptoms remain improving may consider taper down neuropathic pain medications, ? May consider repeating MRI of brain for trigeminal region with without contrast ? ? ?History of present illness: ?Alexa Mora, is a 66 year old female, seen in request by her primary care nurse practitioner Charlynn Court, for evaluation of right facial pain, initial evaluation was on May 28, 2021 ?  ?I reviewed and summarized the referring note. PMHX ?HTN ?HLD ?  ?Since August 2022, she had intermittent right ear numbness tingling, sensitive to touch, it is hard for her to tolerate the glasses sitting on her right ear, very irritable, sometimes radiating discomfort to her right chin, with occasionally sharp electronic radiating pain lasting for few seconds, but denies difficulty chewing swallowing, no rash broke out, ?  ?She was seen by dentist, no clear etiology found ?  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 ?Update October 30, 2021: ?Her right facial pain overall has much improved, now mainly involving the right upper eyelid, extending to right frontal region, right cheek region, sparing right V3 branch territory ? ?For a while she required much higher dose of Lyrica, now doing well with 150 mg 3 times a day, complains of  excessive drowsiness sleepiness with higher dose, ? ? ?OBJECTIVE: ? ?Physical Exam ? ?Vitals:  ? 10/30/21 1338  ?BP: (!) 161/80  ?Pulse: 70  ?Weight: 174 lb (78.9 kg)  ?Height: '5\' 7"'$  (1.702 m)  ? ?Body mass index is 27.25 kg/m?[name] Kitchen ?No results found. ? ?PHYSICAL EXAMNIATION: ? ?Gen: NAD, conversant, well nourised, well groomed                     ?Cardiovascular: Regular rate rhythm, no peripheral edema, warm, nontender. ?Eyes: Conjunctivae clear without exudates or hemorrhage ?Neck: Supple, no carotid bruits. ?Pulmonary: Clear to auscultation bilaterally  ? ?NEUROLOGICAL EXAM: ? ?MENTAL STATUS: ?Speech/cognition: ?Awake, alert, oriented to history taking and casual conversation ?  ?CRANIAL NERVES: ?CN II: Visual fields are full to confrontation.  Pupils are round equal and briskly reactive to light. ?CN III, IV, VI: extraocular movement are normal. No ptosis. ?CN V: Facial sensation is intact to pinprick in all 3 divisions bilaterally. Corneal responses are intact.  ?CN VII: Face is symmetric with normal eye closure and smile. ?CN VIII: Hearing is normal to casual conversation ?CN IX, X: Palate elevates symmetrically. Phonation is normal. ?CN XI: Head turning and shoulder shrug are intact ?CN XII: Tongue is midline with normal movements and no atrophy. ? ?MOTOR: ?There is no pronator drift of out-stretched arms. Muscle bulk and tone are normal. Muscle strength is normal. ? ?REFLEXES: ?Reflexes are 2+ and symmetric at the biceps, triceps, knees, and ankles. Plantar responses are flexor. ? ?SENSORY: ?Intact to light touch, pinprick, positional and vibratory sensation are intact in fingers and toes. ? ?  COORDINATION: ?Rapid alternating movements and fine finger movements are intact. There is no dysmetria on finger-to-nose and heel-knee-shin.   ? ?GAIT/STANCE: ?Posture is normal. Gait is steady with normal steps, base, arm swing, and turning. Heel and toe walking are normal. Tandem gait is normal.  ?Romberg is absent. ?   ? ?ROS:   ?14 system review of systems performed and negative with exception of those listed in HPI ? ?PMH:  ?Past Medical History:  ?Diagnosis Date  ? Diabetes mellitus without complication ([place])   ? Family history of malignant neoplasm of breast   ? GERD (gastroesophageal reflux disease)   ? Hypertension   ? Migraine aura, persistent, intractable   ? ? ?PSH: No past surgical history on file. ? ?Social History:  ?Social History  ? ?Socioeconomic History  ? Marital status: Married  ?  Spouse name: Not on file  ? Number of children: Not on file  ? Years of education: Not on file  ? Highest education level: Not on file  ?Occupational History  ? Not on file  ?Tobacco Use  ? Smoking status: Some Days  ?  Years: 30.00  ?  Types: Cigarettes  ? Smokeless tobacco: Never  ?Substance and Sexual Activity  ? Alcohol use: No  ? Drug use: No  ? Sexual activity: Not on file  ?Other Topics Concern  ? Not on file  ?Social History Narrative  ? Not on file  ? ?Social Determinants of Health  ? ?Financial Resource Strain: Not on file  ?Food Insecurity: Not on file  ?Transportation Needs: Not on file  ?Physical Activity: Not on file  ?Stress: Not on file  ?Social Connections: Not on file  ?Intimate Partner Violence: Not on file  ? ? ?Family History:  ?Family History  ?Problem Relation Age of Onset  ? Breast cancer Sister 13  ?     currently 32  ? Breast cancer Cousin   ?     Age dx?; currently 49; paternal first-cousin  ? ? ?Medications:   ?Current Outpatient Medications on File Prior to Visit  ?Medication Sig Dispense Refill  ? aspirin 81 MG tablet Take 81 mg by mouth daily.    ? atorvastatin (LIPITOR) 10 MG tablet Take 10 mg by mouth daily.    ? cholecalciferol (VITAMIN D) 1000 UNITS tablet Take 2,000 Units by mouth daily.    ? diphenhydrAMINE (BENADRYL) 25 MG tablet Take 25 mg by mouth every 6 (six) hours as needed.    ? hydrochlorothiazide (MICROZIDE) 12.5 MG capsule Take by mouth.    ? metoprolol succinate (TOPROL-XL) 25 MG 24 hr  tablet Take 25 mg by mouth 2 (two) times daily.    ? pregabalin (LYRICA) 150 MG capsule Take 1 capsule (150 mg total) by mouth 4 (four) times daily. 120 capsule 5  ? ?No current facility-administered medications on file prior to visit.  ? ? ?Allergies:   ?Allergies  ?Allergen Reactions  ? Oxcarbazepine Itching  ?  Red, itchy skin  ? Sulfa Antibiotics   ? ? ? ? ? ? ?   ? ? ?

## 2021-12-25 ENCOUNTER — Telehealth: Payer: Self-pay | Admitting: Neurology

## 2021-12-25 DIAGNOSIS — G509 Disorder of trigeminal nerve, unspecified: Secondary | ICD-10-CM

## 2021-12-25 MED ORDER — LAMOTRIGINE 25 MG PO TABS: ORAL_TABLET | ORAL | 0 refills | Status: DC

## 2021-12-25 NOTE — Telephone Encounter (Signed)
I spoke to the patient. She is agreeable to try lamotrigine and verbalized understanding of the titration schedule. She will call back to provide an update when she reaches '75mg'$  BID.   She would also like a new prescription increasing her pregabalin

## 2021-12-25 NOTE — Telephone Encounter (Signed)
Pt states she called Sunday re: her Right facial pain on Sunday and did not get a call back on Sunday or yesterday.  Pt states she is having sever pain and on top of that bad headaches for over a week, please call. Message sent as high priority because pt has been waiting since Sunday to be called back

## 2021-12-25 NOTE — Telephone Encounter (Addendum)
I called the patient. Reports she started having some discomfort two weeks ago but it was tolerable after she self-increased her pregabalin 150 mg to one capsule four times a day (prescribed TID). This past weekend, she started having severe, right-sided pain (since Friday night). She thought is she could wait it out but it has continued without letting up.  She is asking for a change to her treatment plan. She can barely touch her face.

## 2021-12-25 NOTE — Telephone Encounter (Signed)
She reported allergic reaction to Trileptal in the past, itching,  Prescription for lamotrigine Meds ordered this encounter  Medications   lamoTRIgine (LAMICTAL) 25 MG tablet    Sig: 1 twice a day x1wk 2 twice a day x 2nd wk 3 twice a day x3rd wk    Dispense:  84 tablet    Refill:  0  Call for progress when she is on 75 mg twice a day, if it helped her, may write new prescription lamotrigine 100 twice a day  In the meantime if she can tolerate Lyrica, we can give her higher dose 200 mg 3 times a day

## 2021-12-25 NOTE — Addendum Note (Signed)
Addended by: Marcial Pacas on: 12/25/2021 03:02 PM   Modules accepted: Orders

## 2021-12-25 NOTE — Addendum Note (Signed)
Addended by: Noberto Retort C on: 12/25/2021 04:30 PM   Modules accepted: Orders

## 2021-12-26 MED ORDER — PREGABALIN 200 MG PO CAPS: 200.0000 mg | ORAL_CAPSULE | Freq: Every day | ORAL | 5 refills | Status: DC

## 2021-12-26 NOTE — Addendum Note (Signed)
Addended by: Marcial Pacas on: 12/26/2021 12:03 PM   Modules accepted: Orders

## 2021-12-26 NOTE — Telephone Encounter (Signed)
Pt is asking for a call as to why her Lyrica was not called into [facility] [phone], please call her on her cell# [phone]

## 2021-12-26 NOTE — Telephone Encounter (Signed)
Meds ordered this encounter  Medications   lamoTRIgine (LAMICTAL) 25 MG tablet    Sig: 1 twice a day x1wk 2 twice a day x 2nd wk 3 twice a day x3rd wk    Dispense:  84 tablet    Refill:  0   pregabalin (LYRICA) 200 MG capsule    Sig: Take 1 capsule (200 mg total) by mouth daily.    Dispense:  90 capsule    Refill:  5    Void other refills on file for this med. This rx reflects most recent dose adjustment.

## 2021-12-26 NOTE — Telephone Encounter (Signed)
Pt made aware rx's sent, verbalized understanding/appreciation for the call.

## 2021-12-27 ENCOUNTER — Telehealth: Payer: Self-pay | Admitting: Neurology

## 2021-12-27 MED ORDER — LAMOTRIGINE 25 MG PO TABS: ORAL_TABLET | ORAL | 0 refills | Status: AC

## 2021-12-27 MED ORDER — PREGABALIN 200 MG PO CAPS
200.0000 mg | ORAL_CAPSULE | Freq: Every day | ORAL | 5 refills | Status: DC
Start: 2021-12-27 — End: 2022-01-01

## 2021-12-27 NOTE — Telephone Encounter (Signed)
Meds ordered this encounter  Medications   pregabalin (LYRICA) 200 MG capsule    Sig: Take 1 capsule (200 mg total) by mouth daily.    Dispense:  90 capsule    Refill:  5    Void other refills on file for this med. This rx reflects most recent dose adjustment.

## 2021-12-27 NOTE — Addendum Note (Signed)
Addended by: Verlin Grills on: 12/27/2021 09:42 AM   Modules accepted: Orders

## 2021-12-27 NOTE — Telephone Encounter (Signed)
Pt would like a call from the nurse to discuss Pregablin has been send to the wrong pharmacy and to make sure not allergic to the medication.

## 2021-12-31 ENCOUNTER — Other Ambulatory Visit: Payer: Self-pay | Admitting: Neurology

## 2021-12-31 NOTE — Telephone Encounter (Signed)
Pt would like a call from the nurse to discuss directions for taking pregabalin (LYRICA) 200 MG capsule is not correct. Pt said correct instructions take 1 tablet 3 times daily.

## 2022-01-01 MED ORDER — PREGABALIN 200 MG PO CAPS: 200.0000 mg | ORAL_CAPSULE | Freq: Three times a day (TID) | ORAL | 5 refills | Status: DC

## 2022-01-01 NOTE — Telephone Encounter (Signed)
Rx sent on 12/27/21 was for pregabalin '200mg'$ , one capsule daily rather than her most recent dose of TID. She was given the correct amount of capsules but needs the instruction updated.   She also told me that she has been using OTC Aspercreme on her face, sparingly. This has helped her burning sensations.

## 2022-01-22 ENCOUNTER — Other Ambulatory Visit: Payer: Self-pay | Admitting: Neurology

## 2022-01-22 NOTE — Telephone Encounter (Signed)
[facility] ([facility]) calling to correct instruction for pt's pregabalin (LYRICA) 200 MG capsule , pt is unable to get refill because instructions do not match amount of capsules. Would like call from the nurse.

## 2022-01-22 NOTE — Telephone Encounter (Signed)
I called [facility]. I spoke with A[facility]lia. Patient is having difficulty getting her lyrica filled at her pharmacy and is asking for PCP to refill it. They declined and called our office for assistance. [facility] [place]. Patient last filled lyrica on 12/29/21 for a 30 day supply and therefore cannot get another refill until 01/26/22. Also, [facility] entered the RX incorrectly and therefore the [place] Controlled Substance Registry reports that the lyrica '200mg'$  #90 is a 90 day supply but it is actually a 30 day supply. ( See image.) [facility] confirmed that [facility] do not need anything from [place] and will reach out to the patient.

## 2022-01-23 NOTE — Addendum Note (Signed)
Addended by: Noberto Retort C on: 01/23/2022 04:07 PM   Modules accepted: Orders

## 2022-01-23 NOTE — Telephone Encounter (Addendum)
Telephone note by Dr. Krista Blue (on 12/25/21):  In the meantime, if she can tolerate Lyrica, we can give her higher dose 200 mg 3 times a day. _____________________________________ [facility] and [facility] to [place]. They filled an older prescription. They will correct the error and have it ready for pick up today.  _____________________________________ I called the patient to provide her with this update.  _____________________________________

## 2022-01-23 NOTE — Telephone Encounter (Signed)
Pt's husband, Hiyab Nhem (on [place]) Pharmacy has take one tablet daily. Pharmacy said need a new prescription for the correct dosage (1 tablet 3 times a day daily). Would like a call from the nurse to discuss what the correct dosage is

## 2022-01-23 NOTE — Telephone Encounter (Signed)
I spoke to the patient again. She would like the pregabalin '200mg'$ , one capsule TID sent to [facility] at  [address]. [facility]elled at [facility]. Epic updated.

## 2022-01-24 MED ORDER — PREGABALIN 200 MG PO CAPS: 200.0000 mg | ORAL_CAPSULE | Freq: Three times a day (TID) | ORAL | 5 refills | Status: AC

## 2022-05-02 ENCOUNTER — Ambulatory Visit: Payer: Medicare HMO | Admitting: Adult Health

## 2023-07-15 IMAGING — MR MR HEAD WO/W CM
6 of 8 series · 23 of 48 positions shown · IV contrast (multihance)
Comparison: [place] [facility] 11/04/2016. Intracranial MRA
and brain MRI 09/21/2005.

CLINICAL DATA: 65-year-old female with right side facial tingling
and numbness, headaches worse in the last 2 months. No known injury.

"Trigeminal neuralgia"
"Facial hemiatrophy"
EXAM:
MRI HEAD TRIGEMINAL WITHOUT AND WITH CONTRAST
TECHNIQUE: Multiplanar, multi-echo pulse sequences of the face and surrounding
structures, including thin-slice imaging of the trigeminal nerves,
were acquired before and after intravenous contrast administration.
CONTRAST:  15mL MULTIHANCE GADOBENATE DIMEGLUMINE 529 MG/ML IV SOLN

[Series 2: T1 · sagittal · 3.0mm · 0.37mm/px · 3 of 42 slices shown (1 of 3)]
[im 1/42]
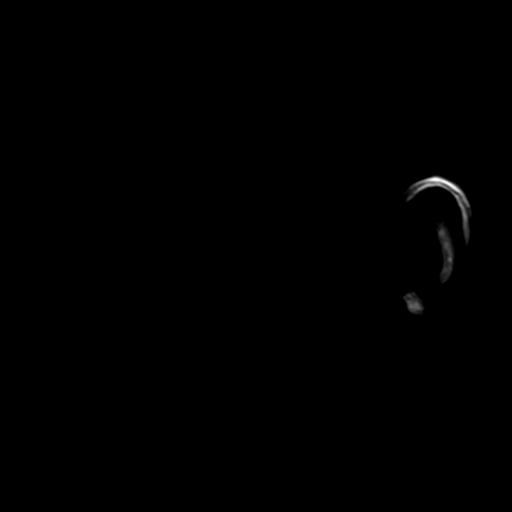
[im 21/42]
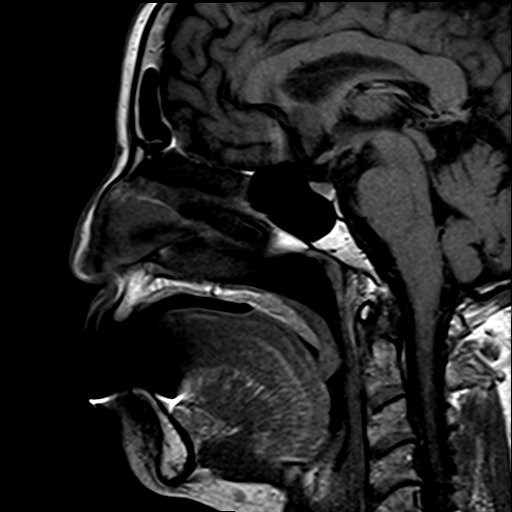
[im 42/42]
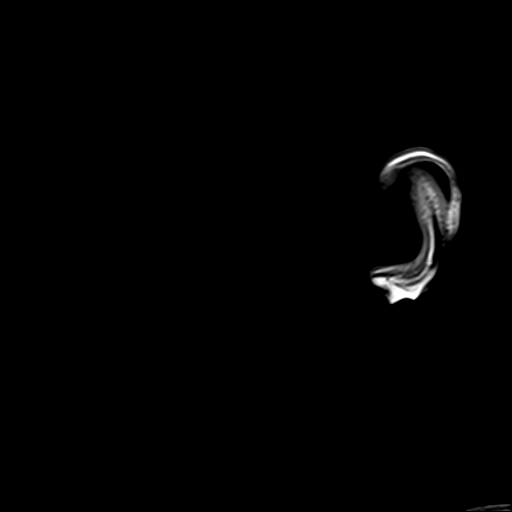

[Series 3: T2 · coronal · 3.0mm · 0.37mm/px · 4 of 44 slices shown]
[im 1/44]
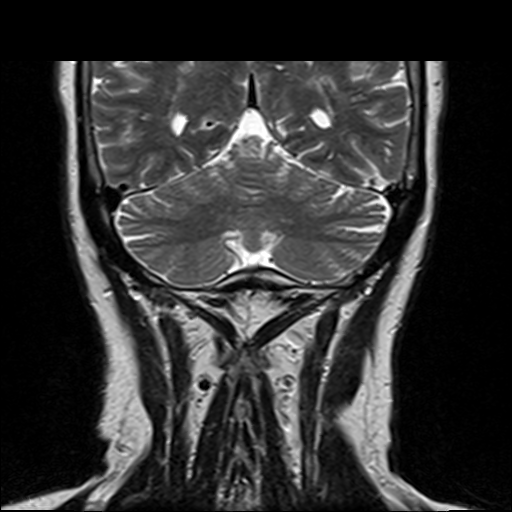
[im 15/44]
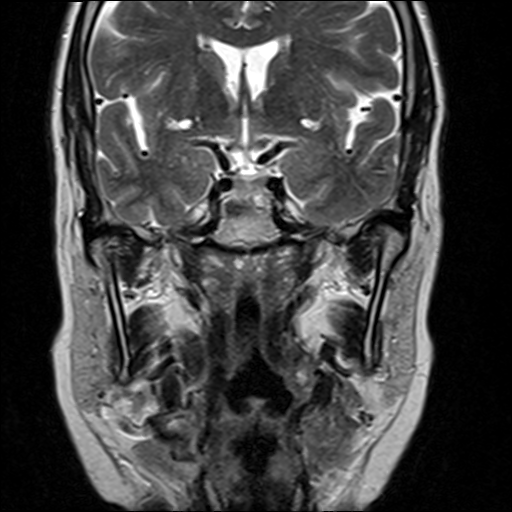
[im 29/44]
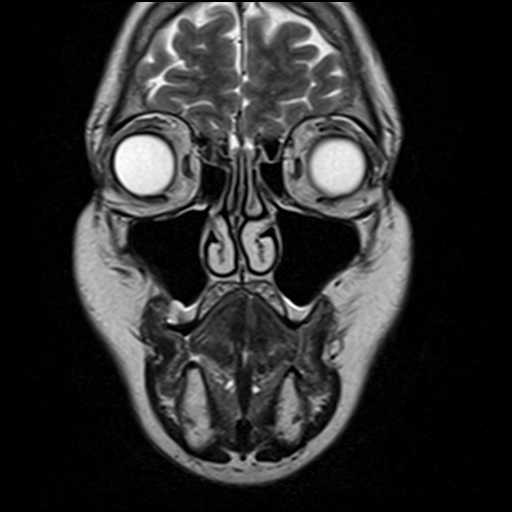
[im 44/44]
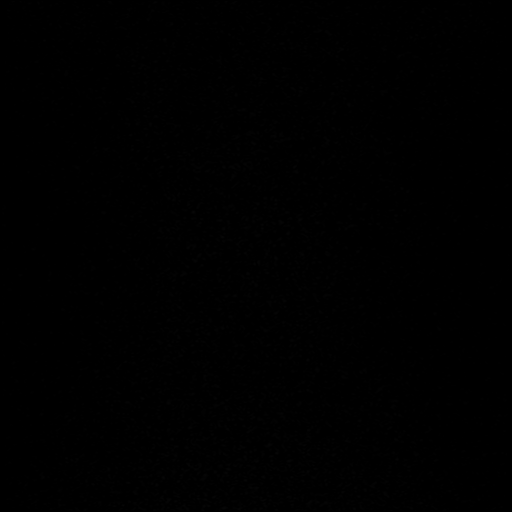

[Series 6: T1 · coronal · 3.0mm · 0.37mm/px · 5 of 52 slices shown (2 of 3)]
[im 1/52]
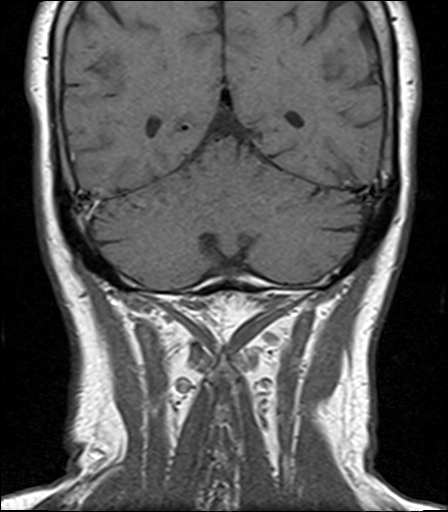
[im 13/52]
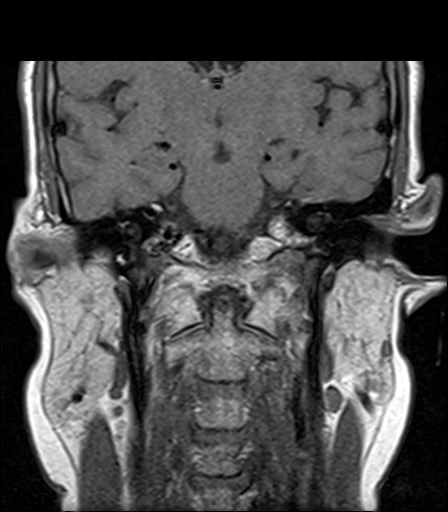
[im 26/52]
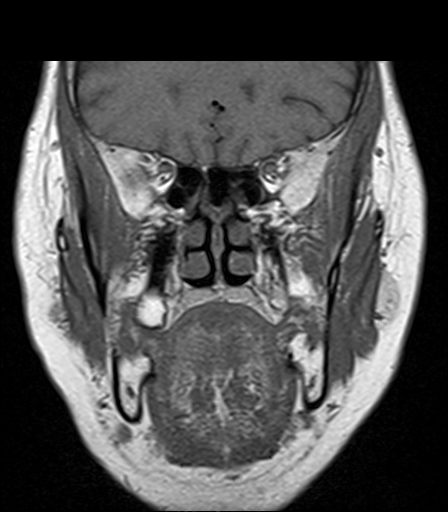
[im 39/52]
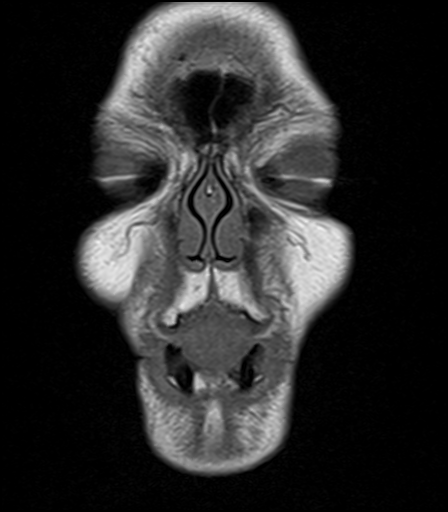
[im 52/52]
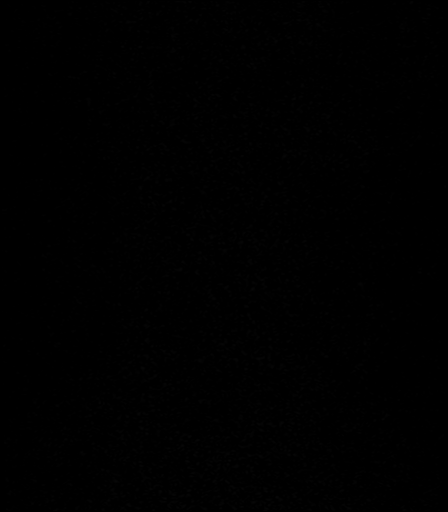

[Series 7: T1 · axial · 3.0mm · 0.59mm/px · z∈[-137,+43]mm · 5 of 55 slices shown (3 of 3)]
[im 1/55]
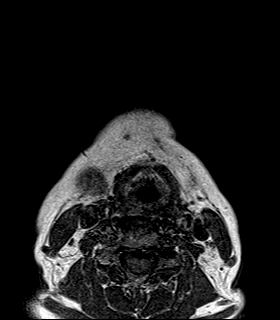
[im 14/55]
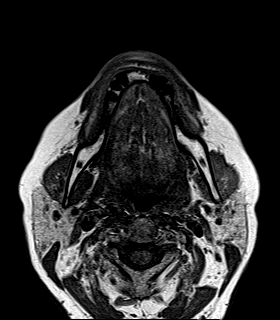
[im 28/55]
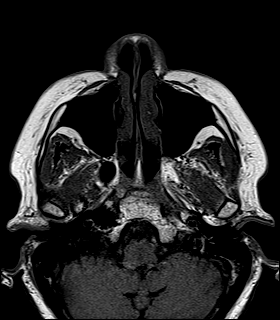
[im 41/55]
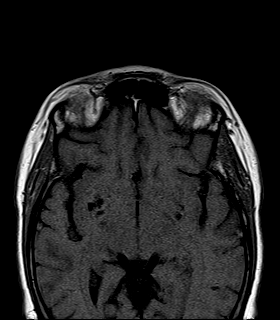
[im 55/55]
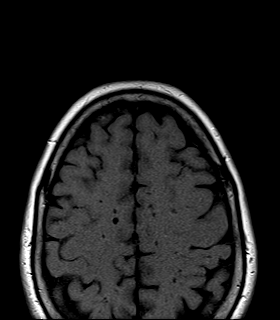

[Series 8: T1 fat-sat post-contrast · coronal · 3.0mm · 0.37mm/px · 5 of 52 slices shown (1 of 2)]
[im 1/52]
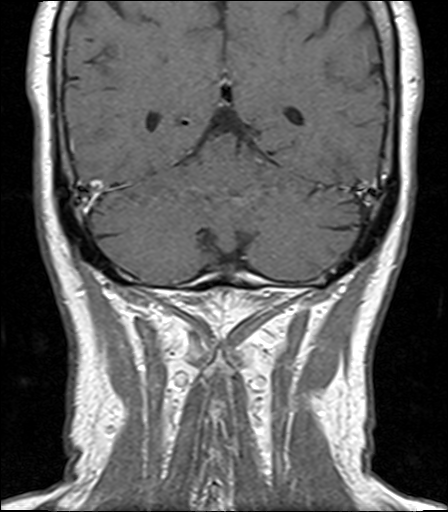
[im 13/52]
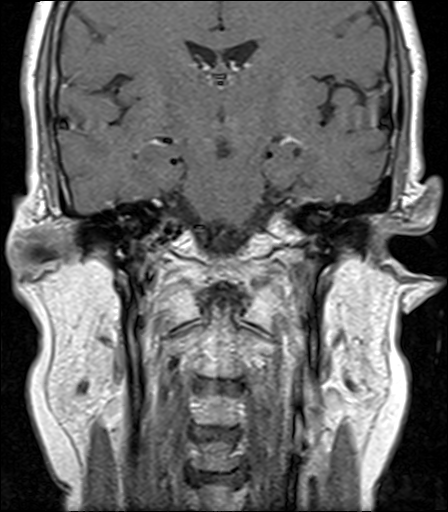
[im 26/52]
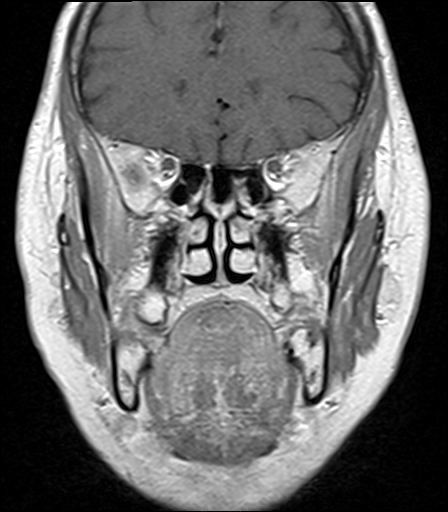
[im 39/52]
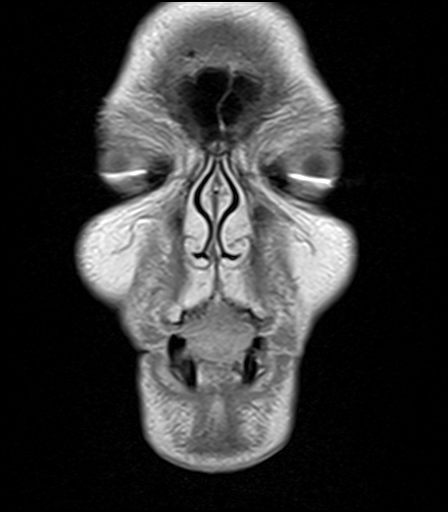
[im 52/52]
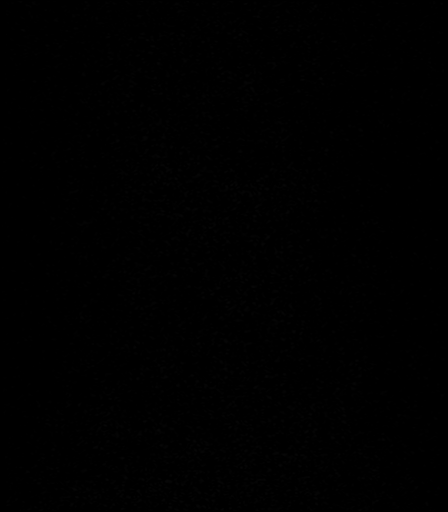

[Series 9: T1 fat-sat post-contrast · axial · 3.0mm · 0.59mm/px · 1 of 55 slices shown (2 of 2)]
[im 1/55]
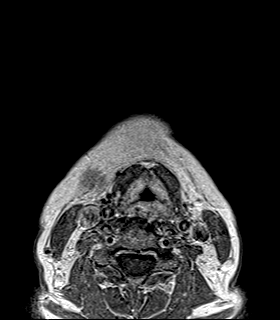

[23 of 48 positions shown; findings below may reference images not displayed]

FINDINGS: Limited intracranial/Trigeminal nerves: The entire brain was not
imaged today according to dedicated trigeminal nerve protocol.

Cerebral volume appears not significantly changed since 5886.
Combined patchy and discrete bilateral cerebral white matter T2
hyperintensity appears to be a combination of perivascular spaces
and nonspecific white matter signal change, progressed since 5886.
No intracranial mass effect or ventriculomegaly.

Brainstem signal is stable since 5886 and within normal limits.

No abnormal intracranial enhancement or dural thickening identified.

Cisternal segments of the bilateral trigeminal nerves appear
symmetric and within normal limits.

Symmetric and normal Meckel's cave.

Symmetric and normal cavernous sinus.

But the right V3 trunk is asymmetrically enlarged (series 6, image
19 and series 7, image 25), asymmetrically STIR hyperintense (series
4, image 24), and appears to be hyperenhancing (series 9, image 24).

But there is no marrow edema in the mandible, and the continuation
of right V3 inferior alveolar nerve appears more symmetric.

Visible V3 trunks in 5886 appeared symmetric.

No associated right parapharyngeal space mass or inflammation. Right
pterygoid palatine fossa appears normal.

Contralateral left V3 is within normal limits. Bilateral V2 trunks
appear symmetric and normal. Inferior alveolar nerves appear
symmetric and normal. No intraorbital mass or inflammation
identified.

Vascular: Major intracranial vascular flow voids are preserved,
within normal limits.

Sinuses/Orbits: Paranasal sinuses appear symmetric and clear.

Soft tissues: Superficial and deep soft tissue spaces of the face
appears symmetric and within normal limits; level 2 nodes are more
numerous on the right (series 4, image 11), but remain normal by
size criteria. No upper cervical lymphadenopathy.

Osseous: Visualized bone marrow signal is within normal limits.

Other: Visible internal auditory structures appear normal. Mastoids
are clear. Bilateral stylomastoid foramina appear normal.
IMPRESSION: 1. Abnormally enlarged, edematous, and enhancing V3 segment of the
Right Trigeminal Nerve.
However, no associated right mandible, parapharyngeal, or facial
pathology is identified, and the other segments of the bilateral
trigeminal nerves appear symmetric and normal.
So the etiology of the Right V3 abnormality is unclear. Is there any
history of right facial skin cancer?

2. Otherwise negative MRI appearance of the face.

3.  No acute intracranial abnormality identified.

## 2024-05-14 ENCOUNTER — Encounter (HOSPITAL_BASED_OUTPATIENT_CLINIC_OR_DEPARTMENT_OTHER): Payer: Self-pay | Admitting: Nurse Practitioner

## 2024-05-14 DIAGNOSIS — Z13820 Encounter for screening for osteoporosis: Secondary | ICD-10-CM
